# Patient Record
Sex: Male | Born: 1950 | Race: White | Hispanic: No | Marital: Married | Smoking: Never smoker
Health system: Southern US, Community
[De-identification: ages and names within clinical notes are randomized; demographics above are authoritative.]

## PROBLEM LIST (undated history)

## (undated) DIAGNOSIS — H9190 Unspecified hearing loss, unspecified ear: Secondary | ICD-10-CM

## (undated) DIAGNOSIS — H8113 Benign paroxysmal vertigo, bilateral: Secondary | ICD-10-CM

## (undated) DIAGNOSIS — K219 Gastro-esophageal reflux disease without esophagitis: Secondary | ICD-10-CM

## (undated) DIAGNOSIS — R972 Elevated prostate specific antigen [PSA]: Secondary | ICD-10-CM

## (undated) HISTORY — DX: Elevated prostate specific antigen (PSA): R97.20

## (undated) HISTORY — DX: Gastro-esophageal reflux disease without esophagitis: K21.9

## (undated) HISTORY — DX: Unspecified hearing loss, unspecified ear: H91.90

## (undated) HISTORY — PX: TONSILLECTOMY: SUR1361

---

## 2005-11-12 ENCOUNTER — Ambulatory Visit: Payer: Self-pay | Admitting: Internal Medicine

## 2013-10-27 ENCOUNTER — Ambulatory Visit: Payer: Self-pay | Admitting: Unknown Physician Specialty

## 2013-10-27 LAB — HM COLONOSCOPY

## 2013-10-29 LAB — PATHOLOGY REPORT

## 2014-01-04 DIAGNOSIS — H2510 Age-related nuclear cataract, unspecified eye: Secondary | ICD-10-CM | POA: Diagnosis not present

## 2014-04-26 DIAGNOSIS — H2511 Age-related nuclear cataract, right eye: Secondary | ICD-10-CM | POA: Diagnosis not present

## 2014-09-16 DIAGNOSIS — R300 Dysuria: Secondary | ICD-10-CM | POA: Diagnosis not present

## 2014-09-16 DIAGNOSIS — Z125 Encounter for screening for malignant neoplasm of prostate: Secondary | ICD-10-CM | POA: Diagnosis not present

## 2014-09-16 DIAGNOSIS — N401 Enlarged prostate with lower urinary tract symptoms: Secondary | ICD-10-CM | POA: Diagnosis not present

## 2014-09-16 DIAGNOSIS — R972 Elevated prostate specific antigen [PSA]: Secondary | ICD-10-CM | POA: Diagnosis not present

## 2014-10-05 DIAGNOSIS — R102 Pelvic and perineal pain: Secondary | ICD-10-CM | POA: Diagnosis not present

## 2014-10-05 DIAGNOSIS — N401 Enlarged prostate with lower urinary tract symptoms: Secondary | ICD-10-CM | POA: Diagnosis not present

## 2014-10-18 DIAGNOSIS — N401 Enlarged prostate with lower urinary tract symptoms: Secondary | ICD-10-CM | POA: Diagnosis not present

## 2014-10-26 DIAGNOSIS — R972 Elevated prostate specific antigen [PSA]: Secondary | ICD-10-CM | POA: Diagnosis not present

## 2014-10-26 DIAGNOSIS — R351 Nocturia: Secondary | ICD-10-CM | POA: Diagnosis not present

## 2014-10-26 DIAGNOSIS — R350 Frequency of micturition: Secondary | ICD-10-CM | POA: Diagnosis not present

## 2014-10-26 DIAGNOSIS — N401 Enlarged prostate with lower urinary tract symptoms: Secondary | ICD-10-CM | POA: Diagnosis not present

## 2014-11-29 DIAGNOSIS — G43109 Migraine with aura, not intractable, without status migrainosus: Secondary | ICD-10-CM | POA: Diagnosis not present

## 2014-11-29 DIAGNOSIS — Z961 Presence of intraocular lens: Secondary | ICD-10-CM | POA: Diagnosis not present

## 2015-01-24 DIAGNOSIS — R351 Nocturia: Secondary | ICD-10-CM | POA: Diagnosis not present

## 2015-01-24 DIAGNOSIS — R3914 Feeling of incomplete bladder emptying: Secondary | ICD-10-CM | POA: Diagnosis not present

## 2015-01-24 DIAGNOSIS — N401 Enlarged prostate with lower urinary tract symptoms: Secondary | ICD-10-CM | POA: Diagnosis not present

## 2015-01-24 DIAGNOSIS — R350 Frequency of micturition: Secondary | ICD-10-CM | POA: Diagnosis not present

## 2015-01-24 DIAGNOSIS — Z125 Encounter for screening for malignant neoplasm of prostate: Secondary | ICD-10-CM | POA: Diagnosis not present

## 2015-07-24 DIAGNOSIS — R350 Frequency of micturition: Secondary | ICD-10-CM | POA: Diagnosis not present

## 2015-07-24 DIAGNOSIS — N401 Enlarged prostate with lower urinary tract symptoms: Secondary | ICD-10-CM | POA: Diagnosis not present

## 2015-07-24 DIAGNOSIS — Z125 Encounter for screening for malignant neoplasm of prostate: Secondary | ICD-10-CM | POA: Diagnosis not present

## 2015-10-16 ENCOUNTER — Encounter: Payer: Self-pay | Admitting: Family Medicine

## 2015-10-16 DIAGNOSIS — R490 Dysphonia: Secondary | ICD-10-CM | POA: Insufficient documentation

## 2015-10-16 DIAGNOSIS — K219 Gastro-esophageal reflux disease without esophagitis: Secondary | ICD-10-CM | POA: Insufficient documentation

## 2015-10-16 DIAGNOSIS — R972 Elevated prostate specific antigen [PSA]: Secondary | ICD-10-CM

## 2015-10-16 DIAGNOSIS — H9190 Unspecified hearing loss, unspecified ear: Secondary | ICD-10-CM

## 2015-10-17 ENCOUNTER — Encounter: Payer: Self-pay | Admitting: Family Medicine

## 2015-10-17 ENCOUNTER — Ambulatory Visit (INDEPENDENT_AMBULATORY_CARE_PROVIDER_SITE_OTHER): Payer: Medicare Other | Admitting: Family Medicine

## 2015-10-17 VITALS — BP 119/78 | HR 62 | Temp 98.6°F | Resp 16 | Ht 70.0 in | Wt 205.0 lb

## 2015-10-17 DIAGNOSIS — N401 Enlarged prostate with lower urinary tract symptoms: Secondary | ICD-10-CM

## 2015-10-17 DIAGNOSIS — L989 Disorder of the skin and subcutaneous tissue, unspecified: Secondary | ICD-10-CM | POA: Diagnosis not present

## 2015-10-17 DIAGNOSIS — Z23 Encounter for immunization: Secondary | ICD-10-CM

## 2015-10-17 DIAGNOSIS — K219 Gastro-esophageal reflux disease without esophagitis: Secondary | ICD-10-CM | POA: Diagnosis not present

## 2015-10-17 DIAGNOSIS — N138 Other obstructive and reflux uropathy: Secondary | ICD-10-CM | POA: Insufficient documentation

## 2015-10-17 DIAGNOSIS — E785 Hyperlipidemia, unspecified: Secondary | ICD-10-CM | POA: Insufficient documentation

## 2015-10-17 DIAGNOSIS — N400 Enlarged prostate without lower urinary tract symptoms: Secondary | ICD-10-CM | POA: Diagnosis not present

## 2015-10-17 DIAGNOSIS — E7800 Pure hypercholesterolemia, unspecified: Secondary | ICD-10-CM | POA: Diagnosis not present

## 2015-10-17 DIAGNOSIS — Z0000 Encounter for general adult medical examination without abnormal findings: Secondary | ICD-10-CM | POA: Diagnosis not present

## 2015-10-17 DIAGNOSIS — J370 Chronic laryngitis: Secondary | ICD-10-CM | POA: Diagnosis not present

## 2015-10-17 NOTE — Progress Notes (Signed)
Name: Rodney Cummings   MRN: [id number]    DOB: 11/09/1950   Date:10/17/2015       Progress Note  Subjective  Chief Complaint  Chief Complaint  Patient presents with  . Annual Exam    HPI Here for annual complete physical.  Has BPH sx.  Sees Dr. Boneta Lucks for this.  He is complaining with some the side effects.  Has had a skin lesion frozen by Dr. Phillip Heal, but it seems that it is coming back.  C/o fatigue.  Doesn't rest well in AM.  He has family hx. Of dementia.  He states that he gets a little forgetful at times.  He does get some horseness on and off.  He chews tobacco daily.  No problem-specific assessment & plan notes found for this encounter.   Past Medical History  Diagnosis Date  . GERD (gastroesophageal reflux disease)   . Elevated PSA   . Hearing loss     Past Surgical History  Procedure Laterality Date  . Tonsillectomy      age 49    Family History  Problem Relation Age of Onset  . Cancer Paternal Grandmother   . Cancer Paternal Grandfather   . Dementia Mother   . Cancer Father     throat/stomach  . Dementia Maternal Aunt   . Dementia Maternal Uncle     Social History   Social History  . Marital Status: Married    Spouse Name: N/A  . Number of Children: N/A  . Years of Education: N/A   Occupational History  . Not on file.   Social History Main Topics  . Smoking status: Never Smoker   . Smokeless tobacco: Current User    Types: Chew  . Alcohol Use: 0.0 oz/week    0 Standard drinks or equivalent per week     Comment: occasional  . Drug Use: No  . Sexual Activity: Not on file   Other Topics Concern  . Not on file   Social History Narrative     Current outpatient prescriptions:  .  tamsulosin (FLOMAX) 0.4 MG CAPS capsule, Take 0.4 mg by mouth daily., Disp: , Rfl:   Not on File   Review of Systems  Constitutional: Negative for fever, chills, weight loss and malaise/fatigue.  HENT: Negative for hearing loss.        Hoareness occ.  Eyes:  Negative for blurred vision and double vision.  Respiratory: Negative for cough, shortness of breath and wheezing.   Cardiovascular: Negative for chest pain, palpitations and leg swelling.  Gastrointestinal: Positive for heartburn (rare). Negative for abdominal pain and blood in stool.  Genitourinary: Positive for urgency and frequency. Negative for dysuria.  Musculoskeletal: Negative for myalgias, back pain and joint pain.  Skin: Negative for rash.  Neurological: Negative for tremors, weakness and headaches.      Objective  Filed Vitals:   10/17/15 1002  BP: 119/78  Pulse: 62  Temp: 98.6 F (37 C)  TempSrc: Oral  Resp: 16  Height: 5\' 10"  (1.778 m)  Weight: 205 lb (92.987 kg)    Physical Exam  Constitutional: He is oriented to person, place, and time and well-developed, well-nourished, and in no distress. No distress.  HENT:  Head: Normocephalic and atraumatic.  Right Ear: External ear normal.  Left Ear: External ear normal.  Nose: Nose normal.  Mouth/Throat: Oropharynx is clear and moist.  Eyes: Conjunctivae and EOM are normal. Pupils are equal, round, and reactive to light. No scleral icterus.  Fundoscopic exam:      The right eye shows no arteriolar narrowing, no AV nicking, no exudate, no hemorrhage and no papilledema.       The left eye shows no arteriolar narrowing, no AV nicking, no exudate, no hemorrhage and no papilledema.  Neck: Normal range of motion. Neck supple. No thyromegaly present.  Cardiovascular: Normal rate, regular rhythm and normal heart sounds.  Exam reveals no gallop and no friction rub.   No murmur heard. Pulmonary/Chest: Effort normal and breath sounds normal. No respiratory distress. He has no wheezes. He has no rales.  Abdominal: Soft. Bowel sounds are normal. He exhibits no distension and no mass. There is no tenderness.  Genitourinary: Penis normal.  Musculoskeletal: Normal range of motion. He exhibits no edema.  Lymphadenopathy:    He has  no cervical adenopathy.  Neurological: He is alert and oriented to person, place, and time. Gait normal.  Skin:  Recurrent dark lesion on penis and and enlarging irregularly shaped lesion on R torso.  Psychiatric: Mood, memory, affect and judgment normal.  Vitals reviewed.      No results found for this or any previous visit (from the past 2160 hour(s)).   Assessment & Plan  Problem List Items Addressed This Visit      Respiratory   Laryngitis, chronic   Relevant Orders   Ambulatory referral to ENT     Digestive   GERD (gastroesophageal reflux disease)   Relevant Orders   CBC with Differential     Musculoskeletal and Integument   Skin lesions   Relevant Orders   Ambulatory referral to Dermatology     Other   Enlarged prostate - Primary   Elevated cholesterol   Relevant Orders   Lipid Profile    Other Visit Diagnoses    Health maintenance examination        Relevant Orders    Comprehensive Metabolic Panel (CMET)    Immunization due        Relevant Orders    Pneumococcal conjugate vaccine 13-valent    Tdap vaccine greater than or equal to 7yo IM       Meds ordered this encounter  Medications  . tamsulosin (FLOMAX) 0.4 MG CAPS capsule    Sig: Take 0.4 mg by mouth daily.  Marland Kitchen DISCONTD: HYDROcodone-acetaminophen (NORCO/VICODIN) 5-325 MG tablet    Sig: Take 1 tablet by mouth as needed.   1. Health maintenance examination  - Comprehensive Metabolic Panel (CMET)  2. Enlarged prostate Cont to see Dr. Boneta Lucks  3. Laryngitis, chronic  - Ambulatory referral to ENT  4. Skin lesions  - Ambulatory referral to Dermatology  5. Immunization due  - Pneumococcal conjugate vaccine 13-valent - Tdap vaccine greater than or equal to 7yo IM  6. Gastroesophageal reflux disease without esophagitis  - CBC with Differential  7. Elevated cholesterol  - Lipid Profile

## 2015-10-18 DIAGNOSIS — E7800 Pure hypercholesterolemia, unspecified: Secondary | ICD-10-CM | POA: Diagnosis not present

## 2015-10-18 DIAGNOSIS — K219 Gastro-esophageal reflux disease without esophagitis: Secondary | ICD-10-CM | POA: Diagnosis not present

## 2015-10-18 DIAGNOSIS — Z0000 Encounter for general adult medical examination without abnormal findings: Secondary | ICD-10-CM | POA: Diagnosis not present

## 2015-10-19 LAB — CBC WITH DIFFERENTIAL/PLATELET
BASOS ABS: 0 10*3/uL (ref 0.0–0.2)
Basos: 1 %
EOS (ABSOLUTE): 0.6 10*3/uL — AB (ref 0.0–0.4)
Eos: 10 %
Hematocrit: 43.7 % (ref 37.5–51.0)
Hemoglobin: 14.6 g/dL (ref 12.6–17.7)
Immature Grans (Abs): 0 10*3/uL (ref 0.0–0.1)
Immature Granulocytes: 0 %
LYMPHS ABS: 1.7 10*3/uL (ref 0.7–3.1)
Lymphs: 30 %
MCH: 30.7 pg (ref 26.6–33.0)
MCHC: 33.4 g/dL (ref 31.5–35.7)
MCV: 92 fL (ref 79–97)
MONOS ABS: 0.6 10*3/uL (ref 0.1–0.9)
Monocytes: 11 %
NEUTROS PCT: 48 %
Neutrophils Absolute: 2.7 10*3/uL (ref 1.4–7.0)
PLATELETS: 267 10*3/uL (ref 150–379)
RBC: 4.75 x10E6/uL (ref 4.14–5.80)
RDW: 13.2 % (ref 12.3–15.4)
WBC: 5.7 10*3/uL (ref 3.4–10.8)

## 2015-10-19 LAB — COMPREHENSIVE METABOLIC PANEL
ALT: 23 IU/L (ref 0–44)
AST: 18 IU/L (ref 0–40)
Albumin/Globulin Ratio: 2.1 (ref 1.2–2.2)
Albumin: 4.2 g/dL (ref 3.6–4.8)
Alkaline Phosphatase: 58 IU/L (ref 39–117)
BUN/Creatinine Ratio: 12 (ref 10–24)
BUN: 11 mg/dL (ref 8–27)
Bilirubin Total: 0.6 mg/dL (ref 0.0–1.2)
CALCIUM: 9 mg/dL (ref 8.6–10.2)
CO2: 25 mmol/L (ref 18–29)
Chloride: 103 mmol/L (ref 96–106)
Creatinine, Ser: 0.94 mg/dL (ref 0.76–1.27)
GFR calc Af Amer: 97 mL/min/{1.73_m2} (ref >59)
GFR, EST NON AFRICAN AMERICAN: 84 mL/min/{1.73_m2} (ref >59)
Globulin, Total: 2 g/dL (ref 1.5–4.5)
Glucose: 87 mg/dL (ref 65–99)
Potassium: 4.8 mmol/L (ref 3.5–5.2)
SODIUM: 140 mmol/L (ref 134–144)
Total Protein: 6.2 g/dL (ref 6.0–8.5)

## 2015-10-19 LAB — LIPID PANEL
Chol/HDL Ratio: 2.6 ratio units (ref 0.0–5.0)
Cholesterol, Total: 205 mg/dL — ABNORMAL HIGH (ref 100–199)
HDL: 80 mg/dL (ref >39)
LDL Calculated: 114 mg/dL — ABNORMAL HIGH (ref 0–99)
Triglycerides: 55 mg/dL (ref 0–149)
VLDL Cholesterol Cal: 11 mg/dL (ref 5–40)

## 2015-10-20 ENCOUNTER — Encounter: Payer: Self-pay | Admitting: *Deleted

## 2015-10-25 ENCOUNTER — Ambulatory Visit: Payer: Medicare Other

## 2015-10-25 ENCOUNTER — Encounter: Payer: Self-pay | Admitting: *Deleted

## 2015-10-25 ENCOUNTER — Ambulatory Visit
Admission: EM | Admit: 2015-10-25 | Discharge: 2015-10-25 | Disposition: A | Discharge status: Home / Self Care | Payer: Medicare Other | Attending: Family Medicine | Admitting: Family Medicine

## 2015-10-25 DIAGNOSIS — F17200 Nicotine dependence, unspecified, uncomplicated: Secondary | ICD-10-CM | POA: Diagnosis not present

## 2015-10-25 DIAGNOSIS — X58XXXA Exposure to other specified factors, initial encounter: Secondary | ICD-10-CM | POA: Diagnosis not present

## 2015-10-25 DIAGNOSIS — K219 Gastro-esophageal reflux disease without esophagitis: Secondary | ICD-10-CM | POA: Diagnosis not present

## 2015-10-25 DIAGNOSIS — S60222A Contusion of left hand, initial encounter: Secondary | ICD-10-CM | POA: Diagnosis not present

## 2015-10-25 DIAGNOSIS — M7989 Other specified soft tissue disorders: Secondary | ICD-10-CM | POA: Diagnosis not present

## 2015-10-25 DIAGNOSIS — M79642 Pain in left hand: Secondary | ICD-10-CM | POA: Diagnosis not present

## 2015-10-25 MED ORDER — NAPROXEN 500 MG PO TABS: 500.0000 mg | ORAL_TABLET | Freq: Two times a day (BID) | ORAL | Status: DC

## 2015-10-25 NOTE — ED Provider Notes (Addendum)
CSN: [id number]     Arrival date & time 10/25/15  [id number] History   First MD Initiated Contact with Patient 10/25/15 [phone]     Chief Complaint  Patient presents with  . Hand Injury   (Consider location/radiation/quality/duration/timing/severity/associated sxs/prior Treatment) HPI: Patient presents today with left hand swelling and pain. Patient states yesterday a metal bar flipped back and hit his left hand. He states he was wearing gloves at the time. He has not put ice on the area yet. He does admit to having a [place] finger repaired on the left fourth digit before. He admits to having full function of all digits of his left hand prior to the injury. He denies any known history of a metacarpal fracture in the past. Denies any other injury anywhere else. He is up-to-date with his tetanus immunization.  Past Medical History  Diagnosis Date  . GERD (gastroesophageal reflux disease)   . Elevated PSA   . Hearing loss    Past Surgical History  Procedure Laterality Date  . Tonsillectomy      age 65   Family History  Problem Relation Age of Onset  . Cancer Paternal Grandmother   . Cancer Paternal Grandfather   . Dementia Mother   . Cancer Father     throat/stomach  . Dementia Maternal Aunt   . Dementia Maternal Uncle    Social History  Substance Use Topics  . Smoking status: Never Smoker   . Smokeless tobacco: Current User    Types: Chew  . Alcohol Use: 0.0 oz/week    0 Standard drinks or equivalent per week     Comment: occasional    Review of Systems: Negative except mentioned above.   Allergies  Review of patient's allergies indicates not on file.  Home Medications   Prior to Admission medications   Medication Sig Start Date End Date Taking? Authorizing Provider  tamsulosin (FLOMAX) 0.4 MG CAPS capsule Take 0.4 mg by mouth daily. 10/06/15  Yes Historical Provider, MD   Meds Ordered and Administered this Visit  Medications - No data to display  BP 115/66 mmHg  Pulse 66   Temp(Src) 97.7 F (36.5 C) (Oral)  Resp 16  Ht 5\' 11"  (1.803 m)  Wt 195 lb (88.451 kg)  BMI 27.21 kg/m2  SpO2 100% No data found.   Physical Exam .  GENERAL: NAD RESP: CTA B CARD: RRR MSK: Left Hand- Mild to moderate erythema and swelling of the distal fourth and fifth metacarpal area, he does have tenderness in this area as well, has slightly decreased range of motion of fourth and fifth digits due to the swelling in the metacarpal area, neurovascularly intact NEURO: CN II-XII grossly intact   ED Course  Procedures (including critical care time)  Labs Review Labs Reviewed - No data to display  Imaging Review No results found.      MDM  A/P: Left hand contusion- Series read as negative for acute fracture, RICE, ace wrap, Naprosyn when necessary, if symptoms persist or worsen I do recommend that he have repeat x-rays and follow-up here or with orthopedics. Discussed signs of infection however unlikely due to the fact that the patient was wearing gloves and does not have any abrasions. Patient appreciative of care.    Paulina Fusi, MD 10/25/15 1031  Paulina Fusi, MD 10/25/15 [phone]

## 2015-10-25 NOTE — ED Notes (Addendum)
Patient injured the back of his left hand with a piece of bar steel while installing fence yesterday.Swelling is present on at the knuckle of his left hand. Patient does have a history of tendon surgery in same location. Patient has broken multiple fingers when younger playing tag football.

## 2015-11-15 ENCOUNTER — Encounter: Payer: Self-pay | Admitting: Emergency Medicine

## 2015-11-15 ENCOUNTER — Ambulatory Visit: Payer: Medicare Other

## 2015-11-15 ENCOUNTER — Ambulatory Visit
Admission: EM | Admit: 2015-11-15 | Discharge: 2015-11-15 | Disposition: A | Discharge status: Home / Self Care | Payer: Medicare Other | Attending: Family Medicine | Admitting: Family Medicine

## 2015-11-15 DIAGNOSIS — H9190 Unspecified hearing loss, unspecified ear: Secondary | ICD-10-CM | POA: Insufficient documentation

## 2015-11-15 DIAGNOSIS — K219 Gastro-esophageal reflux disease without esophagitis: Secondary | ICD-10-CM | POA: Diagnosis not present

## 2015-11-15 DIAGNOSIS — R972 Elevated prostate specific antigen [PSA]: Secondary | ICD-10-CM | POA: Insufficient documentation

## 2015-11-15 DIAGNOSIS — M19042 Primary osteoarthritis, left hand: Secondary | ICD-10-CM | POA: Diagnosis not present

## 2015-11-15 DIAGNOSIS — M79642 Pain in left hand: Secondary | ICD-10-CM | POA: Diagnosis not present

## 2015-11-15 NOTE — ED Notes (Signed)
Pt reports continued left hand pain since seen here 5/17 for same. Pt reports anytime uses left hand pain is sharp and has had continued swelling

## 2015-11-15 NOTE — ED Provider Notes (Signed)
CSN: [id number]     Arrival date & time 11/15/15  1257 History   First MD Initiated Contact with Patient 11/15/15 1416     Chief Complaint  Patient presents with  . Hand Pain   (Consider location/radiation/quality/duration/timing/severity/associated sxs/prior Treatment) HPI: Patient returns to the office today with continued left hand pain. Patient states that he had an injury a few weeks ago and has continued to have pain and some swelling of the area. He however does admit that he has not immobilized the hand but for a few days. He has been doing activity as normal using the hand. His pain is localized mostly to the left fourth metacarpal area. X-rays were negative for fracture initially when he was seen here on 10/25/15.  Past Medical History  Diagnosis Date  . GERD (gastroesophageal reflux disease)   . Elevated PSA   . Hearing loss    Past Surgical History  Procedure Laterality Date  . Tonsillectomy      age 65   Family History  Problem Relation Age of Onset  . Cancer Paternal Grandmother   . Cancer Paternal Grandfather   . Dementia Mother   . Cancer Father     throat/stomach  . Dementia Maternal Aunt   . Dementia Maternal Uncle    Social History  Substance Use Topics  . Smoking status: Never Smoker   . Smokeless tobacco: Current User    Types: Chew  . Alcohol Use: 0.0 oz/week    0 Standard drinks or equivalent per week     Comment: occasional    Review of Systems: Negative except mentioned above.   Allergies  Review of patient's allergies indicates no known allergies.  Home Medications   Prior to Admission medications   Medication Sig Start Date End Date Taking? Authorizing Provider  naproxen (NAPROSYN) 500 MG tablet Take 1 tablet (500 mg total) by mouth 2 (two) times daily. Take with food. 10/25/15   Paulina Fusi, MD  tamsulosin (FLOMAX) 0.4 MG CAPS capsule Take 0.4 mg by mouth daily. 10/06/15   Historical Provider, MD   Meds Ordered and Administered this Visit   Medications - No data to display  BP 106/68 mmHg  Pulse 58  Temp(Src) 97.9 F (36.6 C) (Tympanic)  Resp 16  Ht 6' (1.829 m)  Wt 198 lb (89.812 kg)  BMI 26.85 kg/m2  SpO2 100% No data found.   Physical Exam   GENERAL: NAD MSK: L Hand- no significant swelling appreciated, tenderness localized to 4th metacarpal area, can make fist but discomfort in area, nv intact  NEURO: CN II-XII groslly intact   ED Course  Procedures (including critical care time)  Labs Review Labs Reviewed - No data to display  Imaging Review No results found.    MDM  A/P: Left hand pain/injury- x-rays were repeated and there does not appear to be a fracture. I discussed with the patient that there could be a subtle fracture that is not being picked up on x-ray and that a CT would be needed to look at this further. There is also possibility that there is bone bruising of the area that is still not healed yet. I have asked the patient to follow-up with hand orthopedics for further evaluation and treatment. Patient has been given x-rays on a disc. Advised over-the-counter pain medication or Naprosyn when necessary. Patient appreciative.    Paulina Fusi, MD 11/15/15 [phone]

## 2015-11-20 DIAGNOSIS — R490 Dysphonia: Secondary | ICD-10-CM | POA: Diagnosis not present

## 2015-11-20 DIAGNOSIS — K219 Gastro-esophageal reflux disease without esophagitis: Secondary | ICD-10-CM | POA: Diagnosis not present

## 2015-12-21 DIAGNOSIS — D074 Carcinoma in situ of penis: Secondary | ICD-10-CM | POA: Diagnosis not present

## 2015-12-21 DIAGNOSIS — L821 Other seborrheic keratosis: Secondary | ICD-10-CM | POA: Diagnosis not present

## 2016-01-01 DIAGNOSIS — R490 Dysphonia: Secondary | ICD-10-CM | POA: Diagnosis not present

## 2016-01-01 DIAGNOSIS — K219 Gastro-esophageal reflux disease without esophagitis: Secondary | ICD-10-CM | POA: Diagnosis not present

## 2016-01-24 DIAGNOSIS — R350 Frequency of micturition: Secondary | ICD-10-CM | POA: Diagnosis not present

## 2016-01-24 DIAGNOSIS — R3915 Urgency of urination: Secondary | ICD-10-CM | POA: Diagnosis not present

## 2016-01-24 DIAGNOSIS — R972 Elevated prostate specific antigen [PSA]: Secondary | ICD-10-CM | POA: Diagnosis not present

## 2016-01-24 DIAGNOSIS — R3914 Feeling of incomplete bladder emptying: Secondary | ICD-10-CM | POA: Diagnosis not present

## 2016-01-24 DIAGNOSIS — N401 Enlarged prostate with lower urinary tract symptoms: Secondary | ICD-10-CM | POA: Diagnosis not present

## 2016-04-23 ENCOUNTER — Ambulatory Visit (INDEPENDENT_AMBULATORY_CARE_PROVIDER_SITE_OTHER): Payer: Medicare Other | Admitting: Family Medicine

## 2016-04-23 ENCOUNTER — Encounter: Payer: Self-pay | Admitting: Family Medicine

## 2016-04-23 VITALS — BP 113/72 | HR 51 | Temp 97.9°F | Resp 16 | Ht 70.0 in | Wt 207.0 lb

## 2016-04-23 DIAGNOSIS — K2100 Gastro-esophageal reflux disease with esophagitis, without bleeding: Secondary | ICD-10-CM

## 2016-04-23 DIAGNOSIS — N400 Enlarged prostate without lower urinary tract symptoms: Secondary | ICD-10-CM | POA: Diagnosis not present

## 2016-04-23 DIAGNOSIS — Z23 Encounter for immunization: Secondary | ICD-10-CM

## 2016-04-23 NOTE — Progress Notes (Signed)
Name: Rodney Cummings   MRN: [id number]    DOB: 06-Apr-1951   Date:04/23/2016       Progress Note  Subjective  Chief Complaint  Chief Complaint  Patient presents with  . Gastroesophageal Reflux    HPI Here for f/u of GERD.  Doing well overall on Prilosec.  His prostate sx have improved.  He is concerned about being tired and getting Alzheimer's disease.  No problem-specific Assessment & Plan notes found for this encounter.   Past Medical History:  Diagnosis Date  . Elevated PSA   . GERD (gastroesophageal reflux disease)   . Hearing loss     Past Surgical History:  Procedure Laterality Date  . TONSILLECTOMY     age 73    Family History  Problem Relation Age of Onset  . Cancer Paternal Grandmother   . Cancer Paternal Grandfather   . Dementia Mother   . Cancer Father     throat/stomach  . Dementia Maternal Aunt   . Dementia Maternal Uncle     Social History   Social History  . Marital status: Married    Spouse name: N/A  . Number of children: N/A  . Years of education: N/A   Occupational History  . Not on file.   Social History Main Topics  . Smoking status: Never Smoker  . Smokeless tobacco: Current User    Types: Chew  . Alcohol use 0.0 oz/week     Comment: occasional  . Drug use: No  . Sexual activity: Not on file   Other Topics Concern  . Not on file   Social History Narrative  . No narrative on file     Current Outpatient Prescriptions:  .  omeprazole (PRILOSEC) 40 MG capsule, Take 40 mg by mouth daily., Disp: , Rfl:  .  tamsulosin (FLOMAX) 0.4 MG CAPS capsule, Take 0.4 mg by mouth daily., Disp: , Rfl:   Not on File   Review of Systems  Constitutional: Negative for chills, fever, malaise/fatigue and weight loss.  HENT: Negative for hearing loss and tinnitus.   Eyes: Negative for blurred vision and double vision.  Respiratory: Negative for cough, shortness of breath and wheezing.   Cardiovascular: Negative for chest pain, palpitations and  leg swelling.  Gastrointestinal: Negative for abdominal pain, blood in stool and heartburn.  Genitourinary: Negative for dysuria, frequency and urgency.  Musculoskeletal: Negative for joint pain and myalgias.  Skin: Negative for rash.  Neurological: Negative for dizziness, tingling, tremors, weakness and headaches.      Objective  Vitals:   04/23/16 1056  BP: 113/72  Pulse: (!) 51  Resp: 16  Temp: 97.9 F (36.6 C)  TempSrc: Oral  Weight: 207 lb (93.9 kg)  Height: 5\' 10"  (1.778 m)    Physical Exam  Constitutional: He is oriented to person, place, and time and well-developed, well-nourished, and in no distress. No distress.  HENT:  Head: Normocephalic and atraumatic.  Eyes: Conjunctivae and EOM are normal. Pupils are equal, round, and reactive to light.  Neck: Normal range of motion. Neck supple. No thyromegaly present.  Cardiovascular: Normal rate, regular rhythm and normal heart sounds.  Exam reveals no gallop and no friction rub.   No murmur heard. Pulmonary/Chest: Effort normal and breath sounds normal. No respiratory distress. He has no wheezes. He has no rales.  Abdominal: Soft. Bowel sounds are normal. He exhibits no distension and no mass. There is no tenderness.  Musculoskeletal: He exhibits no edema.  Lymphadenopathy:  He has no cervical adenopathy.  Neurological: He is alert and oriented to person, place, and time.  Vitals reviewed.      No results found for this or any previous visit (from the past 2160 hour(s)).   Assessment & Plan  Problem List Items Addressed This Visit      Digestive   GERD (gastroesophageal reflux disease)   Relevant Medications   omeprazole (PRILOSEC) 40 MG capsule     Other   Enlarged prostate    Other Visit Diagnoses    Need for vaccination    -  Primary   Relevant Orders   Flu vaccine HIGH DOSE PF (Fluzone High dose) (Completed)      Meds ordered this encounter  Medications  . omeprazole (PRILOSEC) 40 MG capsule     Sig: Take 40 mg by mouth daily.   1. Need for vaccination  - Flu vaccine HIGH DOSE PF (Fluzone High dose)  2. Gastroesophageal reflux disease with esophagitis Cont Omeprazole  3. Enlarged prostate

## 2016-08-22 DIAGNOSIS — H04123 Dry eye syndrome of bilateral lacrimal glands: Secondary | ICD-10-CM | POA: Diagnosis not present

## 2016-08-22 DIAGNOSIS — Z961 Presence of intraocular lens: Secondary | ICD-10-CM | POA: Diagnosis not present

## 2016-08-27 DIAGNOSIS — N401 Enlarged prostate with lower urinary tract symptoms: Secondary | ICD-10-CM | POA: Diagnosis not present

## 2016-08-27 DIAGNOSIS — R3916 Straining to void: Secondary | ICD-10-CM | POA: Diagnosis not present

## 2016-08-27 DIAGNOSIS — R350 Frequency of micturition: Secondary | ICD-10-CM | POA: Diagnosis not present

## 2016-08-27 DIAGNOSIS — R3915 Urgency of urination: Secondary | ICD-10-CM | POA: Diagnosis not present

## 2016-10-21 ENCOUNTER — Ambulatory Visit: Payer: Medicare Other | Admitting: Family Medicine

## 2016-11-18 DIAGNOSIS — H26493 Other secondary cataract, bilateral: Secondary | ICD-10-CM | POA: Diagnosis not present

## 2016-11-18 DIAGNOSIS — H532 Diplopia: Secondary | ICD-10-CM | POA: Diagnosis not present

## 2016-11-18 DIAGNOSIS — H43393 Other vitreous opacities, bilateral: Secondary | ICD-10-CM | POA: Diagnosis not present

## 2016-12-24 DIAGNOSIS — I708 Atherosclerosis of other arteries: Secondary | ICD-10-CM | POA: Diagnosis not present

## 2016-12-24 DIAGNOSIS — H26492 Other secondary cataract, left eye: Secondary | ICD-10-CM | POA: Diagnosis not present

## 2016-12-24 DIAGNOSIS — H26493 Other secondary cataract, bilateral: Secondary | ICD-10-CM | POA: Diagnosis not present

## 2016-12-24 DIAGNOSIS — Z961 Presence of intraocular lens: Secondary | ICD-10-CM | POA: Diagnosis not present

## 2016-12-24 DIAGNOSIS — H01003 Unspecified blepharitis right eye, unspecified eyelid: Secondary | ICD-10-CM | POA: Diagnosis not present

## 2017-01-06 DIAGNOSIS — H26491 Other secondary cataract, right eye: Secondary | ICD-10-CM | POA: Diagnosis not present

## 2017-01-14 ENCOUNTER — Ambulatory Visit (INDEPENDENT_AMBULATORY_CARE_PROVIDER_SITE_OTHER): Payer: Medicare Other | Admitting: Family Medicine

## 2017-01-14 ENCOUNTER — Other Ambulatory Visit: Payer: Self-pay | Admitting: Family Medicine

## 2017-01-14 ENCOUNTER — Encounter: Payer: Self-pay | Admitting: Family Medicine

## 2017-01-14 VITALS — BP 101/66 | HR 67 | Temp 98.6°F | Resp 16 | Ht 70.0 in | Wt 197.6 lb

## 2017-01-14 DIAGNOSIS — R5383 Other fatigue: Secondary | ICD-10-CM

## 2017-01-14 DIAGNOSIS — Z125 Encounter for screening for malignant neoplasm of prostate: Secondary | ICD-10-CM

## 2017-01-14 DIAGNOSIS — N401 Enlarged prostate with lower urinary tract symptoms: Secondary | ICD-10-CM

## 2017-01-14 DIAGNOSIS — R799 Abnormal finding of blood chemistry, unspecified: Secondary | ICD-10-CM

## 2017-01-14 DIAGNOSIS — E7800 Pure hypercholesterolemia, unspecified: Secondary | ICD-10-CM

## 2017-01-14 DIAGNOSIS — H9113 Presbycusis, bilateral: Secondary | ICD-10-CM | POA: Diagnosis not present

## 2017-01-14 DIAGNOSIS — E559 Vitamin D deficiency, unspecified: Secondary | ICD-10-CM

## 2017-01-14 DIAGNOSIS — R351 Nocturia: Secondary | ICD-10-CM

## 2017-01-14 DIAGNOSIS — R972 Elevated prostate specific antigen [PSA]: Secondary | ICD-10-CM

## 2017-01-14 DIAGNOSIS — Z1159 Encounter for screening for other viral diseases: Secondary | ICD-10-CM

## 2017-01-14 NOTE — Addendum Note (Signed)
Addended by: Olin Hauser on: 01/14/2017 10:09 PM   Modules accepted: Orders

## 2017-01-14 NOTE — Assessment & Plan Note (Signed)
Stable, chronic high frequency hearing loss, with hearing aids bilateral, previously followed by [facility] for Audiology

## 2017-01-14 NOTE — Assessment & Plan Note (Signed)
Stable chronic BPH with nocturia as LUTS otherwise no significant obstruction Followed by Dr Yves Dill [facility]) - do not have record available - On OTC saw palmetto, failed Flomax (postural symptoms) - Also prior rx Finasteride but he never started - Last PSA unavailable (reported to be approx 4) - No known personal/family history of prostate CA  Plan: 1. Check PSA by request since doing blood work 2. Continue current OTC supplement with improved nocturia 3. Improve hydration 4. Follow-up with existing Urology Dr Yves Dill - will forward PSA If need, and resume routine care, may consider future finasteride but discussed that this lowers PSA needs to be appropriately monitored

## 2017-01-14 NOTE — Patient Instructions (Addendum)
Thank you for coming to the clinic today.  1. We will order blood tests, and contact you by phone with results within few days.  May consider daily Boost or Ensure or daily MVI to get extra.  Sleep Hygiene Recommendations to promote healthy sleep in all patients, especially if symptoms of insomnia are worsening. Due to the nature of sleep rhythms, if your body gets "out of rhythm", it may take some time before your sleep cycle can be "reset".  Please try to follow as many of the following tips as you can, usually there are only a few of these are the primary cause of the problem.  ?To reset your sleep rhythm, go to bed and get up at the same time every day ?Sleep only long enough to feel rested and then get out of bed ?Do not try to force yourself to sleep. If you can't sleep, get out of bed and try again later. ?Avoid naps during the day, unless excessively tired. The more sleeping during the day, then the less sleep your body needs at night.  ?Have coffee, tea, and other foods that have caffeine only in the morning ?Exercise several days a week, but not right before bed ?If you drink alcohol, prefer to have appropriate drink with one meal, but prefer to avoid alcohol in the evening, and bedtime ?If you smoke, avoid smoking, especially in the evening  ?Avoid watching TV or looking at phones, computers, or reading devices ("e-books") that give off light at least 30 minutes before bed. This artificial light sends "awake signals" to your brain and can make it harder to fall asleep. ?Make your bedroom a comfortable place where it is easy to fall asleep: ? Put up shades or special blackout curtains to block light from outside. ? Use a white noise machine to block noise. ? Keep the temperature cool. ?Try your best to solve or at least address your problems before you go to bed ?Use relaxation techniques to manage stress. Ask your health care provider to suggest some techniques that may work  well for you. These may include: ? Breathing exercises. ? Routines to release muscle tension. ? Visualizing peaceful scenes.   DUE for FASTING BLOOD WORK (no food or drink after midnight before the lab appointment, only water or coffee without cream/sugar on the morning of)  SCHEDULE "Lab Only" visit in the morning at the clinic for lab draw in   - Make sure Lab Only appointment is at about 1 week before your next appointment, so that results will be available  Please schedule a Follow-up Appointment to: Return in about 3 months (around 04/16/2017) for Fatigue/Tiredness, lab review.  If you have any other questions or concerns, please feel free to call the clinic or send a message through [facility]. You may also schedule an earlier appointment if necessary.  Additionally, you may be receiving a survey about your experience at our clinic within a few days to 1 week by e-mail or mail. We value your feedback.  Nobie Putnam, DO [place]

## 2017-01-14 NOTE — Assessment & Plan Note (Signed)
Controlled cholesterol on lifestyle Last lipid panel 10/2015  Plan: 1. Briefly discussed ASCVD risk strategies 2. Check fasting lipids tomorrow with other labs 3. Will calculate ASCVD risk and determine if candidate for ASA 81 vs Statin 4. Encourage improved lifestyle - low carb/cholesterol, reduce portion size, continue improving regular exercise 5. Follow-up q 6-12 mo lipids

## 2017-01-14 NOTE — Progress Notes (Signed)
Subjective:    Patient ID: Rodney Cummings, male    DOB: [birth year], 66 y.o.   MRN: [id number]  Rodney Cummings is a 66 y.o. male presenting on 01/14/2017 for Annual Exam  Patient is accompanied by wife, Letta Median. He is here to meet me for first time and establish care with new provider, as previous PCP here has retired. He did not obtain blood work before today's apt.  HPI  CDL physical - Dr Sabra Heck - [facility] ([facility]) 09/18/16  General Fatigue / Tiredness / Low Energy: - New complaint today that he would like to discuss in addition to general physical - Reports that he has been feeling gradually more tired over past 1 year. Difficult to describe exactly, but he and his wife say he sleeps more often and easily takes naps during day, often will get more "tired" quicker during activities. No significant lifestyle changes, he remains retired, active at home working in shop and other household chores - Usual routine goes to bed at 11pm, and wake up 8am, often he will take a nap around 9am and sleeping more during day, often he has days with more hypersomnia and less active and other days very active, and she thinks he does "not know how to pace himself" or he "over-does it" - Describes occasional episodes of more sudden exhaustion than usual with walking, maybe feel dizzy at times, recently if out in heat and not drinking enough water will get dehydrated and can get dizzy if skip meal. But does not endorse any cardiac symptoms (CP, dyspnea) - Also asking about sleep apnea, he had questions of this in past, but he lost weight and improved symptoms significantly. Now snoring is less often and no further witnessed apnea events. See ESS and STOP Bang below  Epworth Sleepiness Scale Total Score: 3-4 (unlikely) Sitting and reading - 0 Watching TV - 0 Sitting inactive in a public place - 0 As a passenger in a car for an hour without a break - 0 Lying down to rest in the afternoon when circumstances  permit - 3 Sitting and talking to someone - 0 Sitting quietly after a lunch without alcohol - 1 In a car, while stopped for a few minutes in traffic - 0  STOP-Bang OSA scoring Snoring no   Tiredness yes   Observed apneas no   Pressure HTN no   BMI > 35 kg/m2 no   Age > 50  yes   Neck (male >17 in; Male >16 in)  No (wear 16.5")   Gender male no   OSA risk low (0-2)  OSA risk intermediate (3-4)  OSA risk high (5+)  Total: 2   -----------------------  History of abnormal cholesterol - Reports history of very good HDL but has had occasional elevated LDL results in past. Never on Statin medication. Also states he took ASA 81 in past but not regularly - Family history of DM but never had abnormal glucose, no prior A1c screening - No history of HTN  History vitamin D mildly low - Currently taking Vitamin D 1,000 iu daily  BPH / History of Elevated PSA - Known history of BPH and mild elevated PSA (approx 4s), followed by Dr Maryan Puls [facility]), he is unsure last visit and when his next visit is, but requests that we check PSA here with labs. - He currently takes OTC prostate health supplement that has saw palmetto among other ingredients - In past failed Flomax 0.4mg  due to  postural dizziness and low BP - He was also rx Finasteride at recent visit but he never started taking this, never filled rx  Health Maintenance: - Colon CA Screening: Last colonoscopy 11/2013 [facility]) has report at home, was told no problem, return 10 year - Routine hepatitis C screening, will get lab - UTD on vaccines, due for flu shot in Fall 2018   Past Medical History:  Diagnosis Date  . Elevated PSA   . GERD (gastroesophageal reflux disease)   . Hearing loss    Past Surgical History:  Procedure Laterality Date  . TONSILLECTOMY     age 44   Social History   Social History  . Marital status: Married    Spouse name: N/A  . Number of children: N/A  . Years of education: N/A    Occupational History  . Not on file.   Social History Main Topics  . Smoking status: Never Smoker  . Smokeless tobacco: Current User    Types: Chew  . Alcohol use 0.0 oz/week     Comment: occasional  . Drug use: No  . Sexual activity: Not on file   Other Topics Concern  . Not on file   Social History Narrative  . No narrative on file   Family History  Problem Relation Age of Onset  . Cancer Paternal Grandmother   . Cancer Paternal Grandfather   . Dementia Mother   . Cancer Father        throat/stomach  . Dementia Maternal Aunt   . Dementia Maternal Uncle    Current Outpatient Prescriptions on File Prior to Visit  Medication Sig  . omeprazole (PRILOSEC) 40 MG capsule Take 40 mg by mouth daily.  . tamsulosin (FLOMAX) 0.4 MG CAPS capsule Take 0.4 mg by mouth daily.   No current facility-administered medications on file prior to visit.     Review of Systems  Constitutional: Positive for fatigue. Negative for activity change, appetite change, chills, diaphoresis and fever.  HENT: Negative for congestion, hearing loss and sinus pressure.   Eyes: Negative for visual disturbance.  Respiratory: Negative for apnea, cough, chest tightness, shortness of breath and wheezing.   Cardiovascular: Negative for chest pain, palpitations and leg swelling.  Gastrointestinal: Negative for abdominal pain, anal bleeding, blood in stool, constipation, diarrhea, nausea and vomiting.  Endocrine: Negative for cold intolerance.  Genitourinary: Negative for decreased urine volume, difficulty urinating, dysuria, frequency, hematuria and testicular pain.  Musculoskeletal: Negative for arthralgias and neck pain.  Skin: Negative for rash.  Allergic/Immunologic: Negative for environmental allergies.  Neurological: Negative for dizziness, weakness, light-headedness, numbness and headaches.  Hematological: Negative for adenopathy.  Psychiatric/Behavioral: Negative for behavioral problems, decreased  concentration, dysphoric mood and sleep disturbance. The patient is not nervous/anxious.    Per HPI unless specifically indicated above     Objective:    BP 101/66   Pulse 67   Temp 98.6 F (37 C) (Oral)   Resp 16   Ht 5\' 10"  (1.778 m)   Wt 197 lb 9.6 oz (89.6 kg)   BMI 28.35 kg/m   Wt Readings from Last 3 Encounters:  01/14/17 197 lb 9.6 oz (89.6 kg)  04/23/16 207 lb (93.9 kg)  11/15/15 198 lb (89.8 kg)    Physical Exam  Constitutional: He is oriented to person, place, and time. He appears well-developed and well-nourished. No distress.  Well-appearing, comfortable, cooperative  HENT:  Head: Normocephalic and atraumatic.  Mouth/Throat: Oropharynx is clear and moist.  Frontal / maxillary  sinuses non-tender. Nares patent without purulence or edema. Bilateral TMs clear without erythema, effusion or bulging, R TM partially obscured by dry cerumen. Oropharynx clear without erythema, exudates, edema or asymmetry.  Mallampati Score 1 - Complete visualization of entire oropharynx soft palate  Eyes: Pupils are equal, round, and reactive to light. Conjunctivae and EOM are normal. Right eye exhibits no discharge. Left eye exhibits no discharge.  Neck: Normal range of motion. Neck supple. No thyromegaly present.  Neck Size 16"  Cardiovascular: Normal rate, regular rhythm, normal heart sounds and intact distal pulses.   No murmur heard. Pulmonary/Chest: Effort normal and breath sounds normal. No respiratory distress. He has no wheezes. He has no rales.  Abdominal: Soft. Bowel sounds are normal. He exhibits no distension and no mass. There is no tenderness.  Musculoskeletal: Normal range of motion. He exhibits no edema or tenderness.  Upper / Lower Extremities: - Normal muscle tone, strength bilateral upper extremities 5/5, lower extremities 5/5  Lymphadenopathy:    He has no cervical adenopathy.  Neurological: He is alert and oriented to person, place, and time.  Distal sensation  intact to light touch all extremities  Skin: Skin is warm and dry. No rash noted. He is not diaphoretic. No erythema.  Psychiatric: He has a normal mood and affect. His behavior is normal.  Well groomed, good eye contact, normal speech and thoughts  Nursing note and vitals reviewed.  Results for orders placed or performed in visit on 10/17/15  Comprehensive Metabolic Panel (CMET)  Result Value Ref Range   Glucose 87 65 - 99 mg/dL   BUN 11 8 - 27 mg/dL   Creatinine, Ser 0.94 0.76 - 1.27 mg/dL   GFR calc non Af Amer 84 >59 mL/min/1.73   GFR calc Af Amer 97 >59 mL/min/1.73   BUN/Creatinine Ratio 12 10 - 24   Sodium 140 134 - 144 mmol/L   Potassium 4.8 3.5 - 5.2 mmol/L   Chloride 103 96 - 106 mmol/L   CO2 25 18 - 29 mmol/L   Calcium 9.0 8.6 - 10.2 mg/dL   Total Protein 6.2 6.0 - 8.5 g/dL   Albumin 4.2 3.6 - 4.8 g/dL   Globulin, Total 2.0 1.5 - 4.5 g/dL   Albumin/Globulin Ratio 2.1 1.2 - 2.2   Bilirubin Total 0.6 0.0 - 1.2 mg/dL   Alkaline Phosphatase 58 39 - 117 IU/L   AST 18 0 - 40 IU/L   ALT 23 0 - 44 IU/L  CBC with Differential  Result Value Ref Range   WBC 5.7 3.4 - 10.8 x10E3/uL   RBC 4.75 4.14 - 5.80 x10E6/uL   Hemoglobin 14.6 12.6 - 17.7 g/dL   Hematocrit 43.7 37.5 - 51.0 %   MCV 92 79 - 97 fL   MCH 30.7 26.6 - 33.0 pg   MCHC 33.4 31.5 - 35.7 g/dL   RDW 13.2 12.3 - 15.4 %   Platelets 267 150 - 379 x10E3/uL   Neutrophils 48 %   Lymphs 30 %   Monocytes 11 %   Eos 10 %   Basos 1 %   Neutrophils Absolute 2.7 1.4 - 7.0 x10E3/uL   Lymphocytes Absolute 1.7 0.7 - 3.1 x10E3/uL   Monocytes Absolute 0.6 0.1 - 0.9 x10E3/uL   EOS (ABSOLUTE) 0.6 (H) 0.0 - 0.4 x10E3/uL   Basophils Absolute 0.0 0.0 - 0.2 x10E3/uL   Immature Granulocytes 0 %   Immature Grans (Abs) 0.0 0.0 - 0.1 x10E3/uL  Lipid Profile  Result Value Ref Range  Cholesterol, Total 205 (H) 100 - 199 mg/dL   Triglycerides 55 0 - 149 mg/dL   HDL 80 >39 mg/dL   VLDL Cholesterol Cal 11 5 - 40 mg/dL   LDL  Calculated 114 (H) 0 - 99 mg/dL   Chol/HDL Ratio 2.6 0.0 - 5.0 ratio units      Assessment & Plan:   Problem List Items Addressed This Visit    Hyperlipidemia    Controlled cholesterol on lifestyle Last lipid panel 10/2015  Plan: 1. Briefly discussed ASCVD risk strategies 2. Check fasting lipids tomorrow with other labs 3. Will calculate ASCVD risk and determine if candidate for ASA 81 vs Statin 4. Encourage improved lifestyle - low carb/cholesterol, reduce portion size, continue improving regular exercise 5. Follow-up q 6-12 mo lipids      Hearing loss    Stable, chronic high frequency hearing loss, with hearing aids bilateral, previously followed by [facility] for Audiology      BPH associated with nocturia    Stable chronic BPH with nocturia as LUTS otherwise no significant obstruction Followed by Dr Yves Dill [facility]) - do not have record available - On OTC saw palmetto, failed Flomax (postural symptoms) - Also prior rx Finasteride but he never started - Last PSA unavailable (reported to be approx 4) - No known personal/family history of prostate CA  Plan: 1. Check PSA by request since doing blood work 2. Continue current OTC supplement with improved nocturia 3. Improve hydration 4. Follow-up with existing Urology Dr Yves Dill - will forward PSA If need, and resume routine care, may consider future finasteride but discussed that this lowers PSA needs to be appropriately monitored       Other Visit Diagnoses    Fatigue, unspecified type    -  Primary   Tiredness      Uncertain exact etiology based on history suggests more multifactorial with likely sleep hygiene, daily activities, nutritional component. Screening does not suggest OSA with two low risk screens, and no significant witnessed apnea symptoms. Clinically does not seem to be related to cardiac etiology with CP or dyspnea, not specifically exertional fatigue, seems to be more general malaise, but then again only  intermittent otherwise seems normal - Without symptoms today in office  Plan - Will check extensive lab work-up, labs placed for tomorrow 8/8 fasting, most labs related to annual physical, others added for work-up of chronic >1 year now gradual worsening fatigue - Follow-up results - Recommend Sleep Hygiene changes per AVS, improve nutrition may take a boost or protein supplement during day, regular meals avoid skipping, improve hydration, stay active, try to even limit excess sleep, may be getting too much sleep - If not improving follow-up as planned within 3 months to re-discuss symptoms, may consider other options such as cardiac with EKG / ECHO at that time if indicated - Return criteria given if more significant worsening or acute symptoms      Meds ordered this encounter  Medications  . Ascorbic Acid (VITAMIN C) 1000 MG tablet    Sig: Take by mouth.  . Cholecalciferol (VITAMIN D3) 1000 units CAPS    Sig: Take by mouth.  . vitamin B-12 (CYANOCOBALAMIN) 1000 MCG tablet    Sig: Take by mouth.  Marland Kitchen GLUCOSAMINE HCL PO    Sig: Take by mouth.  . Multiple Vitamin (MULTI-VITAMINS) TABS    Sig: Take by mouth.  . vitamin E 400 UNIT capsule    Sig: Take by mouth.    Follow up  plan: Return in about 3 months (around 04/16/2017) for Fatigue/Tiredness, lab review.  Nobie Putnam, [facility] 01/14/2017, 10:06 PM

## 2017-01-15 ENCOUNTER — Other Ambulatory Visit: Payer: Medicare Other

## 2017-01-15 ENCOUNTER — Other Ambulatory Visit: Payer: Self-pay

## 2017-01-15 DIAGNOSIS — R351 Nocturia: Secondary | ICD-10-CM

## 2017-01-15 DIAGNOSIS — E7800 Pure hypercholesterolemia, unspecified: Secondary | ICD-10-CM

## 2017-01-15 DIAGNOSIS — Z125 Encounter for screening for malignant neoplasm of prostate: Secondary | ICD-10-CM

## 2017-01-15 DIAGNOSIS — R5383 Other fatigue: Secondary | ICD-10-CM | POA: Diagnosis not present

## 2017-01-15 DIAGNOSIS — N401 Enlarged prostate with lower urinary tract symptoms: Secondary | ICD-10-CM | POA: Diagnosis not present

## 2017-01-15 DIAGNOSIS — E559 Vitamin D deficiency, unspecified: Secondary | ICD-10-CM | POA: Diagnosis not present

## 2017-01-15 DIAGNOSIS — R972 Elevated prostate specific antigen [PSA]: Secondary | ICD-10-CM

## 2017-01-15 DIAGNOSIS — R799 Abnormal finding of blood chemistry, unspecified: Secondary | ICD-10-CM

## 2017-01-15 DIAGNOSIS — Z1159 Encounter for screening for other viral diseases: Secondary | ICD-10-CM | POA: Diagnosis not present

## 2017-01-15 LAB — CBC WITH DIFFERENTIAL/PLATELET
BASOS ABS: 0 {cells}/uL (ref 0–200)
BASOS PCT: 0 %
EOS ABS: 360 {cells}/uL (ref 15–500)
Eosinophils Relative: 8 %
HEMATOCRIT: 45 % (ref 38.5–50.0)
HEMOGLOBIN: 14.8 g/dL (ref 13.2–17.1)
Lymphocytes Relative: 42 %
Lymphs Abs: 1890 cells/uL (ref 850–3900)
MCH: 30.8 pg (ref 27.0–33.0)
MCHC: 32.9 g/dL (ref 32.0–36.0)
MCV: 93.6 fL (ref 80.0–100.0)
MONO ABS: 360 {cells}/uL (ref 200–950)
MPV: 10.2 fL (ref 7.5–12.5)
Monocytes Relative: 8 %
NEUTROS ABS: 1890 {cells}/uL (ref 1500–7800)
Neutrophils Relative %: 42 %
PLATELETS: 261 10*3/uL (ref 140–400)
RBC: 4.81 MIL/uL (ref 4.20–5.80)
RDW: 13.2 % (ref 11.0–15.0)
WBC: 4.5 10*3/uL (ref 3.8–10.8)

## 2017-01-15 LAB — TSH: TSH: 1.99 m[IU]/L (ref 0.40–4.50)

## 2017-01-16 LAB — LIPID PANEL
Cholesterol: 210 mg/dL — ABNORMAL HIGH (ref <200)
HDL: 62 mg/dL (ref >40)
LDL Cholesterol: 137 mg/dL — ABNORMAL HIGH (ref <100)
TRIGLYCERIDES: 55 mg/dL (ref <150)
Total CHOL/HDL Ratio: 3.4 Ratio (ref <5.0)
VLDL: 11 mg/dL (ref <30)

## 2017-01-16 LAB — COMPLETE METABOLIC PANEL WITH GFR
ALT: 18 U/L (ref 9–46)
AST: 17 U/L (ref 10–35)
Albumin: 4.1 g/dL (ref 3.6–5.1)
Alkaline Phosphatase: 45 U/L (ref 40–115)
BUN: 10 mg/dL (ref 7–25)
CHLORIDE: 104 mmol/L (ref 98–110)
CO2: 24 mmol/L (ref 20–32)
Calcium: 9.5 mg/dL (ref 8.6–10.3)
Creat: 0.96 mg/dL (ref 0.70–1.25)
GFR, EST NON AFRICAN AMERICAN: 81 mL/min (ref >=60)
GLUCOSE: 84 mg/dL (ref 65–99)
Potassium: 4.7 mmol/L (ref 3.5–5.3)
SODIUM: 140 mmol/L (ref 135–146)
TOTAL PROTEIN: 6.6 g/dL (ref 6.1–8.1)
Total Bilirubin: 0.9 mg/dL (ref 0.2–1.2)

## 2017-01-16 LAB — TESTOSTERONE TOTAL,FREE,BIO, MALES
ALBUMIN: 4.1 g/dL (ref 3.6–5.1)
Sex Hormone Binding: 64 nmol/L (ref 22–77)
TESTOSTERONE FREE: 37.3 pg/mL — AB (ref 46.0–224.0)
TESTOSTERONE: 493 ng/dL (ref 250–827)
Testosterone, Bioavailable: 70.2 ng/dL — ABNORMAL LOW (ref 110.0–575.0)

## 2017-01-16 LAB — HEPATITIS C ANTIBODY: HCV AB: NONREACTIVE

## 2017-01-16 LAB — VITAMIN D 25 HYDROXY (VIT D DEFICIENCY, FRACTURES): Vit D, 25-Hydroxy: 25 ng/mL — ABNORMAL LOW (ref 30–100)

## 2017-01-16 LAB — HEMOGLOBIN A1C
HEMOGLOBIN A1C: 5 % (ref <5.7)
Mean Plasma Glucose: 97 mg/dL

## 2017-01-17 LAB — REFLEX PSA, FREE
PSA, % FREE: 15 % — AB (ref >25)
PSA, FREE: 0.7 ng/mL

## 2017-01-17 LAB — PSA, TOTAL WITH REFLEX TO PSA, FREE: PSA, TOTAL: 4.7 ng/mL — AB (ref <=4.0)

## 2017-01-30 ENCOUNTER — Encounter: Payer: Self-pay | Admitting: Family Medicine

## 2017-02-03 DIAGNOSIS — D485 Neoplasm of uncertain behavior of skin: Secondary | ICD-10-CM | POA: Diagnosis not present

## 2017-02-03 DIAGNOSIS — C44319 Basal cell carcinoma of skin of other parts of face: Secondary | ICD-10-CM | POA: Diagnosis not present

## 2017-04-17 ENCOUNTER — Encounter: Payer: Self-pay | Admitting: Family Medicine

## 2017-04-17 ENCOUNTER — Ambulatory Visit (INDEPENDENT_AMBULATORY_CARE_PROVIDER_SITE_OTHER): Payer: Medicare Other | Admitting: Family Medicine

## 2017-04-17 VITALS — BP 112/65 | HR 58 | Temp 97.9°F | Resp 16 | Ht 70.0 in | Wt 202.0 lb

## 2017-04-17 DIAGNOSIS — Z23 Encounter for immunization: Secondary | ICD-10-CM

## 2017-04-17 DIAGNOSIS — H8110 Benign paroxysmal vertigo, unspecified ear: Secondary | ICD-10-CM | POA: Diagnosis not present

## 2017-04-17 DIAGNOSIS — N401 Enlarged prostate with lower urinary tract symptoms: Secondary | ICD-10-CM

## 2017-04-17 DIAGNOSIS — R351 Nocturia: Secondary | ICD-10-CM

## 2017-04-17 DIAGNOSIS — R972 Elevated prostate specific antigen [PSA]: Secondary | ICD-10-CM | POA: Diagnosis not present

## 2017-04-17 DIAGNOSIS — G5702 Lesion of sciatic nerve, left lower limb: Secondary | ICD-10-CM | POA: Diagnosis not present

## 2017-04-17 MED ORDER — CYCLOBENZAPRINE HCL 10 MG PO TABS: 5.0000 mg | ORAL_TABLET | Freq: Every evening | ORAL | 0 refills | Status: DC | PRN

## 2017-04-17 MED ORDER — NAPROXEN 500 MG PO TABS: 500.0000 mg | ORAL_TABLET | Freq: Two times a day (BID) | ORAL | 0 refills | Status: DC

## 2017-04-17 NOTE — Progress Notes (Signed)
Subjective:    Patient ID: Rodney Cummings, male    DOB: 03-15-1951, 66 y.o.   MRN: [id number]  Rodney Cummings is a 66 y.o. male presenting on 04/17/2017 for Fatigue (follow up )   HPI   Left Gluteal / Piriformis Pain - Reports newer concern with L gluteal vs hip pain gradual worsening 6 weeks or more, bothering him, seems to hurt worse when lays down at night, and occasional wake him up at night. He states he quit wearing his wallet in Left back pocket a week ago, thought it was a pinched nerve when sitting on wallet driving tractor, seems to have improved some since then. Still active working on farm, limited rest. - Keeping him awake at night - Tried occasional aleve for it some relief. Not taking Tylenol, never on muscle relaxant - No prior X-ray imaging in past. 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 He tried ear drops for few days to weeks without relief. Not tried Epley maneuver. Not tried OTC meclizine or other similar medicines. - Denies fall, injury, nausea vomiting  Elevated PSA / BPH LUTS - Previously followed by [facility], last visit 07/2016, now he would like to change Urologist by preference would like referral to [facility]. His last PSA was 4.7 (01/2017). He had initial prostate biopsy in 2007 unsure exactly - His Urinary function and LUTS are actually improved now - Prior tried OTC supplement, and failed Flomax, never started Finasteride  History of fatigue - not focus today, since discussed other more acute concerns, seems stable since last visit, poor sleep attributed to above problems.  Followed by Dermatology, R facial abnormal skin lesion, reported cancer, he has follow-up for  repeat excision.  Health Maintenance: - Due for Flu Shot, will receive today  Depression screen [name] 2/9 04/17/2017 04/23/2016 10/17/2015  Decreased Interest 0 0 0  Down, Depressed, Hopeless 0 0 0  PHQ - 2 Score 0 0 0    Social History   Tobacco Use  . Smoking status: Never Smoker  . Smokeless tobacco: Current User    Types: Chew  Substance Use Topics  . Alcohol use: Yes    Alcohol/week: 0.0 oz    Comment: occasional  . Drug use: No    Review of Systems Per HPI unless specifically indicated above     Objective:    BP 112/65   Pulse (!) 58   Temp 97.9 F (36.6 C) (Oral)   Resp 16   Ht 5\' 10"  (1.778 m)   Wt 202 lb (91.6 kg)   BMI 28.98 kg/m   Wt Readings from Last 3 Encounters:  04/17/17 202 lb (91.6 kg)  01/14/17 197 lb 9.6 oz (89.6 kg)  04/23/16 207 lb (93.9 kg)    Physical Exam  Constitutional: He is oriented to person, place, and time. He appears well-developed and well-nourished. No distress.  Well-appearing, comfortable, cooperative  HENT:  Head: Normocephalic and atraumatic.  Mouth/Throat: Oropharynx is clear and moist.  Frontal / maxillary sinuses non-tender. Nares patent without purulence or edema. Bilateral TMs clear without erythema, effusion or bulging, only minimal non obstructing cerumen R ear. Oropharynx clear without erythema, exudates, edema or asymmetry.  Did not  perform Dix-Hallpike today by request  Eyes: Conjunctivae are normal. Right eye exhibits no discharge. Left eye exhibits no discharge.  Neck: Normal range of motion. Neck supple. No thyromegaly present.  Cardiovascular: Normal rate, regular rhythm, normal heart sounds and intact distal pulses.  No murmur heard. Pulmonary/Chest: Effort normal and breath sounds normal. No respiratory distress. He has no wheezes. He has no rales.  Musculoskeletal: Normal range of motion. He exhibits no edema.  Low Back / L hip and Greater Trochanter Inspection: BACK - Normal appearance, no spinal  deformity, symmetrical. HIP - Normal appearance, symmetrical, no obvious leg length or pelvis deformity  Palpation: BACK - No tenderness over spinous processes. Bilateral lumbar paraspinal muscles non-tender and without hypertonicity/spasm.  Gluteal - Left piriformis region is area of tenderness some mild spasm and discomfort on deeper palpation.  HIP - Hip and greater trochanter L non tender. Lower extremity thigh calf soft non tender no spasm.  ROM: BACK - Full active ROM forward flex / back extension, rotation L/R without discomfort HIP - Bilateral hip flex/ext supine normal, internal and external rotation normal without problem or limitation.  Special Testing: BACK - Seated SLR negative for radicular pain bilaterally HIP - FABER FADIR normal and non tender no limited movement. Acetabular compression of hip L normal  Strength: Bilateral hip flex/ext 5/5, knee flex/ext 5/5, ankle dorsiflex/plantarflex 5/5 Neurovascular: intact distal sensation to light touch  Lymphadenopathy:    He has no cervical adenopathy.  Neurological: He is alert and oriented to person, place, and time.  Skin: Skin is warm and dry. No rash noted. He is not diaphoretic. No erythema.  Psychiatric: He has a normal mood and affect. His behavior is normal.  Well groomed, good eye contact, normal speech and thoughts  Nursing note and vitals reviewed.  Results for orders placed or performed in visit on 01/30/17  HM COLONOSCOPY  Result Value Ref Range   HM Colonoscopy See Report (in chart) See Report (in chart), Patient Reported      Assessment & Plan:   Problem List Items Addressed This Visit    BPH associated with nocturia    Stable chronic BPH with nocturia as LUTS otherwise no significant obstruction Previously followed by Dr Yves Dill [facility]) - do not have record available - On OTC saw palmetto, failed Flomax (postural symptoms) - Also prior rx Finasteride but he never started - Last PSA 4.7 low  free % (01/2017). Prior Prostate Biopsy approx 2007 per Dr Yves Dill, reported benign - No known personal/family history of prostate CA  Plan: 1. Patient requesting today to change urologist from Dr Yves Dill to [facility] to establish with new Urologist for BPH and PSA monitoring by patient request 2. Continue current OTC supplement with improved nocturia 3. Improve hydration      Relevant Orders   Ambulatory referral to Urology   Elevated PSA    Previously followed by Dr Yves Dill [facility]) - do not have record available  Last PSA 4.7 low free % (01/2017). Prior Prostate Biopsy approx 2007 per Dr Yves Dill, reported benign - No known personal/family history of prostate CA  Plan: 1. Patient requesting today to change urologist from Dr Yves Dill to [facility] to establish with new Urologist for BPH and PSA monitoring by patient request      Relevant Orders   Ambulatory referral to Urology   Piriformis syndrome of left side - Primary    Subacute L gluteal / hip pain w/o sciatica. Suspected due to piriformis syndrome localized on  exam. No inciting injury, but likely provoked by riding tractor, wallet in L pocket No h/o chronic lumbar DJD. -No red flag symptoms. Negative SLR for radiculopathy -Inadequate conservative therapy   Plan: 1. Start anti-inflammatory trial with rx Naprosyn 500mg  BID wc x 2-4 weeks, then PRN 2. Start muscle relaxant with Flexeril 10mg  tabs - take 5-10mg  up to TID PRN, titrate up as tolerated - caution sedation prefer to use ONLY QHS 3. May use Tylenol PRN for breakthrough 4. Encouraged use of heating pad 1-2x daily for now then PRN 5. Avoid prolonged sitting, may take breaks if driving, given handout piriformis stretches for future 6. 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 Handout given with Epley maneuver TID for 1-2 weeks until resolved 2. Rx meclizine PRN for breakthrough symptoms 3. Return criteria, if not improved consider vestibular PT referral       Meds ordered this encounter  Medications  . Saw Palmetto 500 MG CAPS    Sig: Take by mouth.  . Omega-3 Fatty Acids (FISH OIL PO)    Sig: Take by mouth.  . cyclobenzaprine (FLEXERIL) 10 MG tablet    Sig: Take 0.5-1 tablets (5-10 mg total) at bedtime as needed by mouth for muscle spasms.    Dispense:  30 tablet    Refill:  0  . naproxen (NAPROSYN) 500 MG tablet    Sig: Take 1 tablet (500 mg total) 2 (two) times daily with a meal by mouth. For 2-4 weeks then as needed    Dispense:  60 tablet    Refill:  0    Follow up plan: Return in about 6 weeks (around 05/29/2017), or if symptoms worsen or fail to improve, for piriformis hip pain, Vertigo.  Nobie Putnam, DO [facility] 04/17/2017, 4:18 PM

## 2017-04-17 NOTE — Assessment & Plan Note (Signed)
Previously followed by Dr Yves Dill [facility]) - do not have record available  Last PSA 4.7 low free % (01/2017). Prior Prostate Biopsy approx 2007 per Dr Yves Dill, reported benign - No known personal/family history of prostate CA  Plan: 1. Patient requesting today to change urologist from Dr Yves Dill to [facility] to establish with new Urologist for BPH and PSA monitoring by patient request

## 2017-04-17 NOTE — Assessment & Plan Note (Addendum)
Subacute L gluteal / hip pain w/o sciatica. Suspected due to piriformis syndrome localized on exam. No inciting injury, but likely provoked by riding tractor, wallet in L pocket No h/o chronic lumbar DJD. -No red flag symptoms. Negative SLR for radiculopathy -Inadequate conservative therapy   Plan: 1. Start anti-inflammatory trial with rx Naprosyn 500mg  BID wc x 2-4 weeks, then PRN 2. Start muscle relaxant with Flexeril 10mg  tabs - take 5-10mg  up to TID PRN, titrate up as tolerated - caution sedation prefer to use ONLY QHS 3. May use Tylenol PRN for breakthrough 4. Encouraged use of heating pad 1-2x daily for now then PRN 5. Avoid prolonged sitting, may take breaks if driving, given handout piriformis stretches for future 6. Follow-up within 4-6 weeks if not improved or worsening, return criteria given, future may consider formal PT, hip x-rays, Gabapentin trial if lingering sciatica pain after acute flare

## 2017-04-17 NOTE — Assessment & Plan Note (Addendum)
Stable chronic BPH with nocturia as LUTS otherwise no significant obstruction Previously followed by Dr Yves Dill [facility]) - do not have record available - On OTC saw palmetto, failed Flomax (postural symptoms) - Also prior rx Finasteride but he never started - Last PSA 4.7 low free % (01/2017). Prior Prostate Biopsy approx 2007 per Dr Yves Dill, reported benign - No known personal/family history of prostate CA  Plan: 1. Patient requesting today to change urologist from Dr Yves Dill to [facility] to establish with new Urologist for BPH and PSA monitoring by patient request 2. Continue current OTC supplement with improved nocturia 3. Improve hydration

## 2017-04-17 NOTE — Patient Instructions (Addendum)
Thank you for coming to the clinic today.  1. For your Bottom / Hip Pain - this is most likely due to Muscle Spasms in your Piriformis Muscle (deeper in by hip), causing Sciatica (irritation of your sciatic nerve can cause shooting pain down your legs, sometimes with numbness and tingling).  Recommend trial of Anti-inflammatory with Naproxen (Naprosyn) 500mg  tabs - take one with food and plenty of water TWICE daily every day (breakfast and dinner), for next 2 to 4 weeks, then you may take only as needed - DO NOT TAKE any ibuprofen, aleve, motrin while you are taking this medicine - It is safe to take Tylenol Ext Str 500mg  tabs - take 1 to 2 (max dose 1000mg ) every 6 hours as needed for breakthrough pain, max 24 hour daily dose is 6 to 8 tablets or 4000mg   - Start Flexeril (cyclobenzaprine) 10mg  tablets - cut in half for 5mg  at night for muscle relaxant - may make you sedated or sleepy (be careful driving or working on this) if tolerated you can take every 8 hours, half or whole tab  Recommend to start using heating pad on your lower back 1-2x daily for few weeks  Be careful with prolonged sitting (especially if you sit on a wallet or other object this can cause flare of pain), if driving long car trip make sure to stop and take a good break to stretch legs, may find a cushion that works to ease stress of your buttocks and hip muscles.  This pain may take weeks to months to fully resolve, but hopefully it will respond to the medicine initially. All back injuries (small or serious) are slow to heal since we use our back muscles every day. Be careful with turning, twisting, lifting, sitting / standing for prolonged periods, and avoid re-injury.  If your symptoms significantly worsen with more pain, or new symptoms with weakness in one or both legs, new or different shooting leg pains, numbness in legs or groin, loss of control or retention of urine or bowel movements, please call back for advice and  you may need to go directly to the Emergency Department.   [facility] -1st floor [place],  [place]  [address] Phone: [phone]  ---------------------------  You have symptoms of Vertigo (Benign Paroxysmal Positional Vertigo) - This is commonly caused by inner ear fluid imbalance, sometimes can be worsened by allergies and sinus symptoms, otherwise it can occur randomly sometimes and we may never discover the exact cause. - To treat this, try the Epley Manuever (see diagrams/instructions below) at home up to 3 times a day for 1-2 weeks or until symptoms resolve - You may take Meclizine as needed up to 3 times a day for dizziness, this will not cure symptoms but may help. Caution may make you drowsy.  If you develop significant worsening episode with vertigo that does not improve and you get severe headache, loss of vision, arm or leg weakness, slurred speech, or other concerning symptoms please seek immediate medical attention at Emergency Department.  Please schedule a follow-up appointment with Dr Parks Ranger within 4 weeks if Vertigo not improving, and will consider Referral to Vestibular Rehab  See the next page for images describing the Epley Manuever.     ----------------------------------------------------------------------------------------------------------------------       Please schedule a Follow-up Appointment to: Return in about 6 weeks (around 05/29/2017), or if symptoms worsen or fail to improve, for  piriformis hip pain, Vertigo.  If you have any other questions or concerns, please feel free to call the clinic or send a message through [place]. You may also schedule an earlier appointment if necessary.  Additionally, you may be receiving a survey about your experience at our clinic within a few days to 1 week by e-mail or mail. We value your feedback.  Nobie Putnam,  DO [facility], [facility]     Piriformis Syndrome Rehabilitation Exercises   You may do all of these exercises right away once acute pain starts to improve on medication or treatment.  Piriformis stretch: Lying on your back with both knees bent, rest the ankle of your injured leg over the knee of your uninjured leg. Grasp the thigh of your uninjured leg and pull that knee toward your chest. You will feel a stretch along the buttocks and possibly along the outside of your hip on the injured side. Hold this for 15 to 30 seconds. Repeat 3 times.  Standing hamstring stretch: Place the heel of your leg on a stool about 15 inches high. Keep your knee straight. Lean forward, bending at the hips until you feel a mild stretch in the back of your thigh. Make sure you do not roll your shoulders and bend at the waist when doing this or you will stretch your lower back instead. Hold the stretch for 15 to 30 seconds. Repeat 3 times.  Pelvic tilt: Lie on your back with your knees bent and your feet flat on the floor. Tighten your abdominal muscles and push your lower back into the floor. Hold this position for 5 seconds, then relax. Do 3 sets of 10.  Partial curl: Lie on your back with your knees bent and your feet flat on the floor. Tighten your stomach muscles and flatten your back against the floor. Tuck your chin to your chest. With your hands stretched out in front of you, curl your upper body forward until your shoulders clear the floor. Hold this position for 3 seconds. Don't hold your breath. It helps to breathe out as you lift your shoulders up. Relax. Repeat 10 times. Build to 3 sets of 10. To challenge yourself, clasp your hands behind your head and keep your elbows out to the side.  Prone hip extension: Lie on your stomach with your legs straight out behind you. Tighten up your buttocks muscles and lift one leg off the floor about 8 inches. Keep your knee straight. Hold for 5 seconds. Then  lower your leg and relax. Do 3 sets of 10.  If both legs are affected, you may repeat this exercise for the other leg.

## 2017-04-30 ENCOUNTER — Encounter: Payer: Self-pay | Admitting: Urology

## 2017-04-30 ENCOUNTER — Ambulatory Visit (INDEPENDENT_AMBULATORY_CARE_PROVIDER_SITE_OTHER): Payer: Medicare Other | Admitting: Urology

## 2017-04-30 VITALS — BP 124/83 | HR 65 | Ht 70.0 in | Wt 195.4 lb

## 2017-04-30 DIAGNOSIS — N401 Enlarged prostate with lower urinary tract symptoms: Secondary | ICD-10-CM | POA: Diagnosis not present

## 2017-04-30 DIAGNOSIS — N4340 Spermatocele of epididymis, unspecified: Secondary | ICD-10-CM | POA: Diagnosis not present

## 2017-04-30 DIAGNOSIS — R972 Elevated prostate specific antigen [PSA]: Secondary | ICD-10-CM

## 2017-04-30 DIAGNOSIS — N138 Other obstructive and reflux uropathy: Secondary | ICD-10-CM | POA: Diagnosis not present

## 2017-04-30 LAB — MICROSCOPIC EXAMINATION
Epithelial Cells (non renal): NONE SEEN /hpf (ref 0–10)
RBC, UA: NONE SEEN /hpf (ref 0–?)

## 2017-04-30 LAB — URINALYSIS, COMPLETE
BILIRUBIN UA: NEGATIVE
GLUCOSE, UA: NEGATIVE
Leukocytes, UA: NEGATIVE
NITRITE UA: NEGATIVE
PROTEIN UA: NEGATIVE
RBC UA: NEGATIVE
Specific Gravity, UA: 1.02 (ref 1.005–1.030)
UUROB: 0.2 mg/dL (ref 0.2–1.0)
pH, UA: 6 (ref 5.0–7.5)

## 2017-04-30 LAB — BLADDER SCAN AMB NON-IMAGING

## 2017-04-30 MED ORDER — SILODOSIN 8 MG PO CAPS: 8.0000 mg | ORAL_CAPSULE | Freq: Every day | ORAL | 1 refills | Status: DC

## 2017-04-30 NOTE — Progress Notes (Signed)
04/30/2017 9:06 AM   Rodney Cummings [birth year] [id number]  Referring provider: Olin Hauser, DO [address] [place], [place] [address]  Chief Complaint  Patient presents with  . Elevated PSA    HPI: Rodney Cummings is a 66 year old male seen in consultation at the request of Dr. Parks Ranger for evaluation of lower urinary tract symptoms and an elevated PSA.  He has previously seen Dr. Eliberto Ivory and was initially having significant nocturia at 4-5 times per night.  He was started on tamsulosin which he could only tolerate for 2 weeks and had to discontinue secondary to orthostatic side effects.  He states he was told there were no other alternatives.  Over the past several months his nocturia has resolved however he does have bothersome decreased force and caliber of his urinary stream and intermittent urinary stream.  Denies dysuria or gross hematuria.  Denies flank, abdominal, pelvic or scrotal pain.  He is taking saw palmetto and was also given an Rx for finasteride by Dr. Parks Ranger however only took for 2 weeks then discontinued due to side effects of worsening voiding symptoms.  IPSS was 17/35/QOL 4/6  Past urologic history remarkable for stone disease status post endoscopic stone removal in 1986.  He also saw Dr. Madelin Headings in 2007 and underwent a prostate biopsy for a PSA in the mid 4 range.  His last PSA August 2018 was 4.7.   PMH: Past Medical History:  Diagnosis Date  . Elevated PSA   . GERD (gastroesophageal reflux disease)   . Hearing loss     Surgical History: Past Surgical History:  Procedure Laterality Date  . TONSILLECTOMY     age 49    Home Medications:  Allergies as of 04/30/2017   No Known Allergies     Medication List        Accurate as of 04/30/17  9:06 AM. Always use your most recent med list.          aspirin EC 81 MG tablet Take 81 mg by mouth daily.   GLUCOSAMINE HCL PO Take by mouth.   MULTI-VITAMINS Tabs Take by mouth.   omeprazole 40  MG capsule Commonly known as:  PRILOSEC Take 40 mg by mouth daily.   Vitamin D3 1000 units Caps Take 5,000 Units by mouth.       Allergies: No Known Allergies  Family History: Family History  Problem Relation Age of Onset  . Dementia Mother   . Cancer Father        throat/stomach  . Dementia Maternal Aunt   . Cancer Paternal Grandmother   . Cancer Paternal Grandfather   . Dementia Maternal Uncle     Social History:  reports that  has never smoked. His smokeless tobacco use includes chew. He reports that he drinks alcohol. He reports that he does not use drugs.  ROS: UROLOGY Frequent Urination?: No Hard to postpone urination?: Yes Burning/pain with urination?: No Get up at night to urinate?: No Leakage of urine?: Yes Urine stream starts and stops?: Yes Trouble starting stream?: Yes Do you have to strain to urinate?: Yes Blood in urine?: No Urinary tract infection?: No Sexually transmitted disease?: No Injury to kidneys or bladder?: No Painful intercourse?: No Weak stream?: Yes Erection problems?: Yes Penile pain?: No  Gastrointestinal Nausea?: No Vomiting?: No Indigestion/heartburn?: No Diarrhea?: No Constipation?: No  Constitutional Fever: No Night sweats?: No Weight loss?: No Fatigue?: Yes  Skin Skin rash/lesions?: Yes Itching?: No  Eyes Blurred vision?: No  Double vision?: No  Ears/Nose/Throat Sore throat?: No Sinus problems?: No  Hematologic/Lymphatic Swollen glands?: No Easy bruising?: Yes  Cardiovascular Leg swelling?: No Chest pain?: No  Respiratory Cough?: No Shortness of breath?: No  Endocrine Excessive thirst?: Yes  Musculoskeletal Back pain?: Yes Joint pain?: Yes  Neurological Headaches?: No Dizziness?: Yes  Psychologic Depression?: No Anxiety?: No  Physical Exam: BP 124/83 (BP Location: Right Arm, Patient Position: Sitting, Cuff Size: Normal)   Pulse 65   Ht 5\' 10"  (1.778 m)   Wt 195 lb 6.4 oz (88.6 kg)    BMI 28.04 kg/m   Constitutional:  Alert and oriented, No acute distress. HEENT: [place] AT, moist mucus membranes.  Trachea midline, no masses. Cardiovascular: No clubbing, cyanosis, or edema. Respiratory: Normal respiratory effort, no increased work of breathing. GI: Abdomen is soft, nontender, nondistended, no abdominal masses GU: No CVA tenderness.  Penis circumcised without lesions, testes descended bilaterally without masses or tenderness, approximately 3 cm transilluminating left supratesticular mass consistent with spermatocele.  Prostate 35 g, smooth without nodules. Skin: No rashes, bruises or suspicious lesions. Lymph: No cervical or inguinal adenopathy. Neurologic: Grossly intact, no focal deficits, moving all 4 extremities. Psychiatric: Normal mood and affect.  Laboratory Data: Lab Results  Component Value Date   WBC 4.5 01/15/2017   HGB 14.8 01/15/2017   HCT 45.0 01/15/2017   MCV 93.6 01/15/2017   PLT 261 01/15/2017    Lab Results  Component Value Date   CREATININE 0.96 01/15/2017    Lab Results  Component Value Date   TESTOSTERONE 493 01/15/2017    Lab Results  Component Value Date   HGBA1C 5.0 01/15/2017    Assessment & Plan:    1.  BPH with lower urinary tract symptoms Bothersome lower urinary tract symptoms.  Side effects with tamsulosin or not tolerable.  We discussed the lower incidence of orthostatic side effects with silodosin and Rx was sent to his pharmacy.  Follow-up 4 weeks for symptom reassessment.  PVR by bladder scan was 16 mL.  2. Elevated PSA Previous benign biopsy with stable PSA.  PSA was repeated today.  DRE was benign.  - Urinalysis, Complete - Bladder Scan (Post Void Residual) in office  3.  Left spermatocele Asymptomatic    Abbie Sons, MD  [facility] [address] [place], [place] [address] [phone]

## 2017-05-01 LAB — PSA: Prostate Specific Ag, Serum: 6.2 ng/mL — ABNORMAL HIGH (ref 0.0–4.0)

## 2017-05-04 ENCOUNTER — Other Ambulatory Visit: Payer: Self-pay | Admitting: Urology

## 2017-05-04 DIAGNOSIS — R972 Elevated prostate specific antigen [PSA]: Secondary | ICD-10-CM

## 2017-05-05 ENCOUNTER — Other Ambulatory Visit: Payer: Self-pay

## 2017-05-05 MED ORDER — DIAZEPAM 10 MG PO TABS: 10.0000 mg | ORAL_TABLET | Freq: Once | ORAL | 0 refills | Status: AC

## 2017-05-06 ENCOUNTER — Telehealth: Payer: Self-pay

## 2017-05-06 NOTE — Telephone Encounter (Signed)
-----   Message from Abbie Sons, MD sent at 05/04/2017  9:43 AM EST ----- Repeat PSA significantly elevated over baseline at 6.2.  Recommend scheduling a prostate MRI.

## 2017-05-06 NOTE — Telephone Encounter (Signed)
Results were given and MRI scheduled.

## 2017-05-13 ENCOUNTER — Ambulatory Visit (HOSPITAL_COMMUNITY): Admission: RE | Admit: 2017-05-13 | Payer: Medicare Other | Source: Ambulatory Visit

## 2017-05-14 ENCOUNTER — Ambulatory Visit (HOSPITAL_COMMUNITY): Payer: Medicare Other

## 2017-05-16 ENCOUNTER — Telehealth: Payer: Self-pay | Admitting: Family Medicine

## 2017-05-16 ENCOUNTER — Telehealth: Payer: Self-pay

## 2017-05-16 DIAGNOSIS — K2100 Gastro-esophageal reflux disease with esophagitis, without bleeding: Secondary | ICD-10-CM

## 2017-05-16 MED ORDER — OMEPRAZOLE 40 MG PO CPDR: 40.0000 mg | DELAYED_RELEASE_CAPSULE | Freq: Every day | ORAL | 3 refills | Status: DC

## 2017-05-16 NOTE — Telephone Encounter (Signed)
Pt called inquiring about PA being completed for his medication. The name of medication was not left. Attempted to call pt back and was not able to get in touch with him about the medication.

## 2017-05-16 NOTE — Telephone Encounter (Signed)
Pt called requesting omeprazole 40 mg mail order  [facility]. Pt call back  # is [phone]

## 2017-05-21 ENCOUNTER — Other Ambulatory Visit: Payer: Self-pay

## 2017-05-21 ENCOUNTER — Ambulatory Visit (HOSPITAL_COMMUNITY)
Admission: RE | Admit: 2017-05-21 | Discharge: 2017-05-21 | Disposition: A | Discharge status: Home / Self Care | Payer: Medicare Other | Source: Ambulatory Visit | Attending: Urology | Admitting: Urology

## 2017-05-21 DIAGNOSIS — R972 Elevated prostate specific antigen [PSA]: Secondary | ICD-10-CM | POA: Diagnosis not present

## 2017-05-21 DIAGNOSIS — N400 Enlarged prostate without lower urinary tract symptoms: Secondary | ICD-10-CM | POA: Insufficient documentation

## 2017-05-21 LAB — POCT I-STAT CREATININE: Creatinine, Ser: 1 mg/dL (ref 0.61–1.24)

## 2017-05-21 MED ORDER — GADOBENATE DIMEGLUMINE 529 MG/ML IV SOLN
20.0000 mL | Freq: Once | INTRAVENOUS | Status: AC | PRN
  Administered 2017-05-21: 18 mL via INTRAVENOUS

## 2017-06-05 ENCOUNTER — Ambulatory Visit (INDEPENDENT_AMBULATORY_CARE_PROVIDER_SITE_OTHER): Payer: Medicare Other | Admitting: Urology

## 2017-06-05 ENCOUNTER — Encounter: Payer: Self-pay | Admitting: Urology

## 2017-06-05 VITALS — BP 119/73 | HR 73 | Ht 72.0 in | Wt 195.0 lb

## 2017-06-05 DIAGNOSIS — N4340 Spermatocele of epididymis, unspecified: Secondary | ICD-10-CM | POA: Diagnosis not present

## 2017-06-05 DIAGNOSIS — R972 Elevated prostate specific antigen [PSA]: Secondary | ICD-10-CM

## 2017-06-05 NOTE — Progress Notes (Signed)
06/05/2017 5:54 PM   Rodney Cummings 19-Aug-1950 [id number]  Referring provider: Arlis Porta., MD No address on file  Chief Complaint  Patient presents with  . Benign Prostatic Hypertrophy    4wk     HPI: 66 year old male presents for follow-up of an elevated PSA.  He was seen November 2018 for a PSA of 6.2.  He had a previous biopsy around 2007 for a PSA in the mid 4 range.  He did have bothersome lower urinary tract symptoms and previously intolerant of tamsulosin.  Rx silodosin was given however will need prior authorization.  He did have his prostate MRI which showed a 55.8 cc prostate.  There was a PI-RADS 4 lesion in the left posterolateral peripheral zone at the mid gland to base and a PIRADS 3 lesion in the left central zone at the base.   PMH: Past Medical History:  Diagnosis Date  . Elevated PSA   . GERD (gastroesophageal reflux disease)   . Hearing loss     Surgical History: Past Surgical History:  Procedure Laterality Date  . TONSILLECTOMY     age 9    Home Medications:  Allergies as of 06/05/2017   No Known Allergies     Medication List        Accurate as of 06/05/17  5:54 PM. Always use your most recent med list.          aspirin EC 81 MG tablet Take 81 mg by mouth daily.   GLUCOSAMINE HCL PO Take by mouth.   MULTI-VITAMINS Tabs Take by mouth.   omeprazole 40 MG capsule Commonly known as:  PRILOSEC Take 1 capsule (40 mg total) by mouth daily.   silodosin 8 MG Caps capsule Commonly known as:  RAPAFLO Take 1 capsule (8 mg total) by mouth daily with breakfast.   Vitamin D3 1000 units Caps Take 5,000 Units by mouth.       Allergies: No Known Allergies  Family History: Family History  Problem Relation Age of Onset  . Dementia Mother   . Cancer Father        throat/stomach  . Dementia Maternal Aunt   . Cancer Paternal Grandmother   . Cancer Paternal Grandfather   . Dementia Maternal Uncle     Social History:  reports  that  has never smoked. His smokeless tobacco use includes chew. He reports that he drinks alcohol. He reports that he does not use drugs.  ROS: UROLOGY Frequent Urination?: No Hard to postpone urination?: Yes Burning/pain with urination?: No Get up at night to urinate?: No Leakage of urine?: No Urine stream starts and stops?: No Trouble starting stream?: Yes Do you have to strain to urinate?: No Blood in urine?: No Urinary tract infection?: No Sexually transmitted disease?: No Injury to kidneys or bladder?: No Painful intercourse?: No Weak stream?: Yes Erection problems?: No Penile pain?: No  Gastrointestinal Nausea?: No Vomiting?: No Indigestion/heartburn?: No Diarrhea?: No Constipation?: No  Constitutional Fever: No Night sweats?: No Weight loss?: No Fatigue?: Yes  Skin Skin rash/lesions?: No Itching?: No  Eyes Blurred vision?: No Double vision?: No  Ears/Nose/Throat Sore throat?: No Sinus problems?: No  Hematologic/Lymphatic Swollen glands?: No Easy bruising?: No  Cardiovascular Leg swelling?: No Chest pain?: No  Respiratory Cough?: No Shortness of breath?: No  Endocrine Excessive thirst?: No  Musculoskeletal Back pain?: No Joint pain?: Yes  Neurological Headaches?: No Dizziness?: No  Psychologic Depression?: No Anxiety?: No  Physical Exam: BP 119/73  Pulse 73   Ht 6' (1.829 m)   Wt 195 lb (88.5 kg)   BMI 26.45 kg/m   Constitutional:  Alert and oriented, No acute distress. HEENT: [place] AT, moist mucus membranes.  Trachea midline, no masses. Cardiovascular: No clubbing, cyanosis, or edema. Respiratory: Normal respiratory effort, no increased work of breathing. GI: Abdomen is soft, nontender, nondistended, no abdominal masses GU: No CVA tenderness. Skin: No rashes, bruises or suspicious lesions. Lymph: No cervical or inguinal adenopathy. Neurologic: Grossly intact, no focal deficits, moving all 4 extremities. Psychiatric: Normal  mood and affect.  Laboratory Data: Lab Results  Component Value Date   WBC 4.5 01/15/2017   HGB 14.8 01/15/2017   HCT 45.0 01/15/2017   MCV 93.6 01/15/2017   PLT 261 01/15/2017    Lab Results  Component Value Date   CREATININE 1.00 05/21/2017    Lab Results  Component Value Date   PSA1 6.2 (H) 04/30/2017    Lab Results  Component Value Date   TESTOSTERONE 493 01/15/2017    Assessment & Plan:    1. Elevated PSA We discussed the significance of a pi RADS 4 lesion and that it is suspicious for prostate cancer.  A fusion biopsy was recommended in [place] and he is agreeable to schedule.  Potential risks were discussed including bleeding and infection/sepsis.  2. Spermatocele At his last visit he was found to have a 3 cm spermatocele.  He was concerned about this lesion and requested a scrotal ultrasound. - US Scrotum; Future    Abbie Sons, [place] [address], [place], [place] [address] [phone]

## 2017-06-09 ENCOUNTER — Ambulatory Visit
Admission: RE | Admit: 2017-06-09 | Discharge: 2017-06-09 | Disposition: A | Discharge status: Home / Self Care | Payer: Medicare Other | Source: Ambulatory Visit | Attending: Urology | Admitting: Urology

## 2017-06-09 DIAGNOSIS — N503 Cyst of epididymis: Secondary | ICD-10-CM | POA: Diagnosis not present

## 2017-06-09 DIAGNOSIS — N4340 Spermatocele of epididymis, unspecified: Secondary | ICD-10-CM | POA: Insufficient documentation

## 2017-06-11 ENCOUNTER — Ambulatory Visit: Payer: Medicare Other | Admitting: Urology

## 2017-06-12 ENCOUNTER — Telehealth: Payer: Self-pay

## 2017-06-12 NOTE — Telephone Encounter (Signed)
Spoke with pt in reference to scrotal ultrasound results. Pt wanted to know if cyst was going to be removed. Please advise.

## 2017-06-12 NOTE — Telephone Encounter (Signed)
-----   Message from Abbie Sons, MD sent at 06/12/2017  9:33 AM EST ----- Scrotal ultrasound showed a benign cyst

## 2017-06-12 NOTE — Telephone Encounter (Signed)
Left pt mess to call 

## 2017-06-13 NOTE — Telephone Encounter (Signed)
I would only recc surgical removal if he was having significant symptoms

## 2017-06-17 ENCOUNTER — Other Ambulatory Visit: Payer: Self-pay

## 2017-06-17 MED ORDER — SILODOSIN 8 MG PO CAPS: 8.0000 mg | ORAL_CAPSULE | Freq: Every day | ORAL | 3 refills | Status: DC

## 2017-06-17 NOTE — Telephone Encounter (Signed)
Spoke with pt in reference surgical removal of cyst. Pt voiced understanding. Pt inquired about when he would be scheduled for fusion bx in [place]. Can you help?

## 2017-06-26 ENCOUNTER — Other Ambulatory Visit: Payer: Self-pay | Admitting: Family Medicine

## 2017-06-26 MED ORDER — ALFUZOSIN HCL ER 10 MG PO TB24: 10.0000 mg | ORAL_TABLET | Freq: Every day | ORAL | 11 refills | Status: DC

## 2017-06-27 DIAGNOSIS — Z85828 Personal history of other malignant neoplasm of skin: Secondary | ICD-10-CM | POA: Insufficient documentation

## 2017-06-27 DIAGNOSIS — C44319 Basal cell carcinoma of skin of other parts of face: Secondary | ICD-10-CM | POA: Diagnosis not present

## 2017-07-08 DIAGNOSIS — H04129 Dry eye syndrome of unspecified lacrimal gland: Secondary | ICD-10-CM | POA: Diagnosis not present

## 2017-07-08 DIAGNOSIS — H43393 Other vitreous opacities, bilateral: Secondary | ICD-10-CM | POA: Diagnosis not present

## 2017-07-08 DIAGNOSIS — H26493 Other secondary cataract, bilateral: Secondary | ICD-10-CM | POA: Diagnosis not present

## 2017-07-09 ENCOUNTER — Encounter: Payer: Self-pay | Admitting: Family Medicine

## 2017-07-09 DIAGNOSIS — C44319 Basal cell carcinoma of skin of other parts of face: Secondary | ICD-10-CM | POA: Insufficient documentation

## 2017-11-13 DIAGNOSIS — C44319 Basal cell carcinoma of skin of other parts of face: Secondary | ICD-10-CM | POA: Diagnosis not present

## 2017-11-24 ENCOUNTER — Ambulatory Visit (INDEPENDENT_AMBULATORY_CARE_PROVIDER_SITE_OTHER): Payer: Medicare Other | Admitting: Urology

## 2017-11-24 ENCOUNTER — Encounter: Payer: Self-pay | Admitting: Urology

## 2017-11-24 VITALS — BP 113/74 | HR 58 | Resp 16 | Ht 72.0 in | Wt 197.2 lb

## 2017-11-24 DIAGNOSIS — R972 Elevated prostate specific antigen [PSA]: Secondary | ICD-10-CM

## 2017-11-24 NOTE — Progress Notes (Signed)
11/24/2017 12:06 PM   Rodney Cummings Oct 31, 1950 [id number]  Referring provider: No referring provider defined for this encounter.  Chief Complaint  Patient presents with  . Follow-up    HPI: 67 year old male presents for follow-up of an elevated PSA.  He had a previous biopsy around 2007 for PSA in the mid 4 range.  He was seen for a bump in his PSA to 6.2 and a prostate MRI showed a PI-RADS 4 and PI-RADS 3 lesions.  A fusion biopsy was recommended.  The patient states he wanted to hold off on scheduling until his son got married June 15.  He has no bothersome lower urinary tract symptoms.   PMH: Past Medical History:  Diagnosis Date  . Elevated PSA   . GERD (gastroesophageal reflux disease)   . Hearing loss     Surgical History: Past Surgical History:  Procedure Laterality Date  . TONSILLECTOMY     age 66    Home Medications:  Allergies as of 11/24/2017   No Known Allergies     Medication List        Accurate as of 11/24/17 12:06 PM. Always use your most recent med list.          alfuzosin 10 MG 24 hr tablet Commonly known as:  UROXATRAL Take 1 tablet (10 mg total) by mouth daily with breakfast.   aspirin EC 81 MG tablet Take 81 mg by mouth daily.   GLUCOSAMINE HCL PO Take by mouth.   MULTI-VITAMINS Tabs Take by mouth.   omeprazole 40 MG capsule Commonly known as:  PRILOSEC Take 1 capsule (40 mg total) by mouth daily.   Vitamin D3 1000 units Caps Take 5,000 Units by mouth.       Allergies: No Known Allergies  Family History: Family History  Problem Relation Age of Onset  . Dementia Mother   . Cancer Father        throat/stomach  . Dementia Maternal Aunt   . Cancer Paternal Grandmother   . Cancer Paternal Grandfather   . Dementia Maternal Uncle     Social History:  reports that he has never smoked. His smokeless tobacco use includes chew. He reports that he drinks alcohol. He reports that he does not use  drugs.  ROS: UROLOGY Frequent Urination?: No Hard to postpone urination?: Yes Burning/pain with urination?: No Get up at night to urinate?: No Leakage of urine?: No Urine stream starts and stops?: No Trouble starting stream?: No Do you have to strain to urinate?: No Blood in urine?: No Urinary tract infection?: No Sexually transmitted disease?: No Injury to kidneys or bladder?: No Painful intercourse?: No Weak stream?: No Erection problems?: No Penile pain?: No  Gastrointestinal Nausea?: No Vomiting?: No Indigestion/heartburn?: No Diarrhea?: No Constipation?: No  Constitutional Fever: No Night sweats?: No Weight loss?: No Fatigue?: No  Skin Skin rash/lesions?: No Itching?: No  Eyes Blurred vision?: No Double vision?: No  Ears/Nose/Throat Sore throat?: No Sinus problems?: No  Hematologic/Lymphatic Swollen glands?: No Easy bruising?: No  Cardiovascular Leg swelling?: No Chest pain?: No  Respiratory Cough?: No Shortness of breath?: No  Endocrine Excessive thirst?: No  Musculoskeletal Back pain?: No Joint pain?: Yes  Neurological Headaches?: No Dizziness?: No  Psychologic Depression?: No Anxiety?: No  Physical Exam: BP 113/74   Pulse (!) 58   Resp 16   Ht 6' (1.829 m)   Wt 197 lb 3.2 oz (89.4 kg)   SpO2 96%   BMI 26.75 kg/m  Constitutional:  Alert and oriented, No acute distress. HEENT: [place] AT, moist mucus membranes.  Trachea midline, no masses. Cardiovascular: No clubbing, cyanosis, or edema. Respiratory: Normal respiratory effort, no increased work of breathing. GI: Abdomen is soft, nontender, nondistended, no abdominal masses GU: No CVA tenderness Lymph: No cervical or inguinal lymphadenopathy. Skin: No rashes, bruises or suspicious lesions. Neurologic: Grossly intact, no focal deficits, moving all 4 extremities. Psychiatric: Normal mood and affect.   Assessment & Plan:   67 year old male with an elevated PSA and PI-RADS  4/PI-RADS 3 lesions on prostate MRI.  I again discussed fusion biopsy and he is in agreement with scheduling and understands the procedure will be performed at [facility] in [place].  PSA was repeated today.   Abbie Sons, [place] [address] [place], [place] [address] [phone]

## 2017-11-25 ENCOUNTER — Telehealth: Payer: Self-pay

## 2017-11-25 LAB — PSA: PROSTATE SPECIFIC AG, SERUM: 4.2 ng/mL — AB (ref 0.0–4.0)

## 2017-11-25 NOTE — Telephone Encounter (Signed)
Called pt no answer. LM for pt informing pt of the information below. Advised pt to call back for any questions or concerns.

## 2017-11-25 NOTE — Telephone Encounter (Signed)
-----   Message from Abbie Sons, MD sent at 11/25/2017  7:48 AM EDT ----- PSA was better at 4.2

## 2017-11-26 DIAGNOSIS — L570 Actinic keratosis: Secondary | ICD-10-CM | POA: Diagnosis not present

## 2017-11-26 DIAGNOSIS — L814 Other melanin hyperpigmentation: Secondary | ICD-10-CM | POA: Diagnosis not present

## 2017-12-30 DIAGNOSIS — R972 Elevated prostate specific antigen [PSA]: Secondary | ICD-10-CM | POA: Diagnosis not present

## 2017-12-30 DIAGNOSIS — N411 Chronic prostatitis: Secondary | ICD-10-CM | POA: Diagnosis not present

## 2018-01-01 ENCOUNTER — Other Ambulatory Visit: Payer: Self-pay | Admitting: Urology

## 2018-01-07 ENCOUNTER — Ambulatory Visit (INDEPENDENT_AMBULATORY_CARE_PROVIDER_SITE_OTHER): Payer: Medicare Other | Admitting: Urology

## 2018-01-07 ENCOUNTER — Encounter: Payer: Self-pay | Admitting: Urology

## 2018-01-07 VITALS — BP 117/79 | HR 61 | Ht 72.0 in | Wt 195.3 lb

## 2018-01-07 DIAGNOSIS — R972 Elevated prostate specific antigen [PSA]: Secondary | ICD-10-CM

## 2018-01-07 NOTE — Progress Notes (Signed)
01/07/2018 10:20 AM   Rodney Cummings 20-May-1951 [id number]  Referring provider: No referring provider defined for this encounter.  Chief Complaint  Patient presents with  . Follow-up   Urologic history: 1.  Elevated PSA -Prostate biopsy ~2007 for PSA in the mid 4 range with benign pathology. -Recent PSA bumped 6.2 with MRI showing PI-RADS 3 and PI-RADS 4 lesions with prostate volume 56 g.  Fusion biopsy performed at [facility] 12/30/2017  2.  BPH with lower urinary tract symptoms; on alfuzosin  3.  History of stone disease   HPI: 67 year old male presents for fusion biopsy results.  He had no post biopsy complaints.  Pathology: Standard 12 core biopsy and targeted lesions all showed benign prostate tissue.  One core from the targeted biopsy did show focal chronic inflammatory change.   PMH: Past Medical History:  Diagnosis Date  . Elevated PSA   . GERD (gastroesophageal reflux disease)   . Hearing loss     Surgical History: Past Surgical History:  Procedure Laterality Date  . TONSILLECTOMY     age 58    Home Medications:  Allergies as of 01/07/2018   No Known Allergies     Medication List        Accurate as of 01/07/18 10:20 AM. Always use your most recent med list.          alfuzosin 10 MG 24 hr tablet Commonly known as:  UROXATRAL Take 1 tablet (10 mg total) by mouth daily with breakfast.   aspirin EC 81 MG tablet Take 81 mg by mouth daily.   GLUCOSAMINE HCL PO Take by mouth.   MULTI-VITAMINS Tabs Take by mouth.   omeprazole 40 MG capsule Commonly known as:  PRILOSEC Take 1 capsule (40 mg total) by mouth daily.   Vitamin D3 1000 units Caps Take 5,000 Units by mouth.       Allergies: No Known Allergies  Family History: Family History  Problem Relation Age of Onset  . Dementia Mother   . Cancer Father        throat/stomach  . Dementia Maternal Aunt   . Cancer Paternal Grandmother   . Cancer Paternal Grandfather   . Dementia  Maternal Uncle     Social History:  reports that he has never smoked. His smokeless tobacco use includes chew. He reports that he drinks alcohol. He reports that he does not use drugs.  ROS: UROLOGY Frequent Urination?: No Hard to postpone urination?: Yes Burning/pain with urination?: No Get up at night to urinate?: No Leakage of urine?: No Urine stream starts and stops?: No Trouble starting stream?: No Do you have to strain to urinate?: No Blood in urine?: No Urinary tract infection?: No Sexually transmitted disease?: No Injury to kidneys or bladder?: No Painful intercourse?: No Weak stream?: Yes Erection problems?: No Penile pain?: No  Gastrointestinal Nausea?: No Vomiting?: No Indigestion/heartburn?: No Diarrhea?: No Constipation?: No  Constitutional Fever: No Night sweats?: No Weight loss?: No Fatigue?: No  Skin Skin rash/lesions?: No Itching?: No  Eyes Blurred vision?: No Double vision?: No  Ears/Nose/Throat Sore throat?: No Sinus problems?: No  Hematologic/Lymphatic Swollen glands?: No Easy bruising?: No  Cardiovascular Leg swelling?: No Chest pain?: No  Respiratory Cough?: No Shortness of breath?: No  Endocrine Excessive thirst?: No  Musculoskeletal Back pain?: No Joint pain?: No  Neurological Headaches?: No Dizziness?: No  Psychologic Depression?: No Anxiety?: No  Physical Exam: BP 117/79 (BP Location: Left Arm, Patient Position: Sitting, Cuff Size: Normal)  Pulse 61   Ht 6' (1.829 m)   Wt 195 lb 4.8 oz (88.6 kg)   BMI 26.49 kg/m   Constitutional:  Alert and oriented, No acute distress.   Assessment & Plan:   Elevated PSA with benign fusion prostate biopsy.  Recommend follow-up 6 months for PSA/DRE.   Return in about 6 months (around 07/10/2018) for Recheck, PSA.  Abbie Sons, [facility] [address] [place], [place] [address] [phone]

## 2018-01-14 DIAGNOSIS — Z85828 Personal history of other malignant neoplasm of skin: Secondary | ICD-10-CM | POA: Diagnosis not present

## 2018-01-14 DIAGNOSIS — I781 Nevus, non-neoplastic: Secondary | ICD-10-CM | POA: Diagnosis not present

## 2018-01-14 DIAGNOSIS — L578 Other skin changes due to chronic exposure to nonionizing radiation: Secondary | ICD-10-CM | POA: Diagnosis not present

## 2018-01-14 DIAGNOSIS — L570 Actinic keratosis: Secondary | ICD-10-CM | POA: Diagnosis not present

## 2018-01-14 DIAGNOSIS — L821 Other seborrheic keratosis: Secondary | ICD-10-CM | POA: Diagnosis not present

## 2018-01-14 DIAGNOSIS — Z1283 Encounter for screening for malignant neoplasm of skin: Secondary | ICD-10-CM | POA: Diagnosis not present

## 2018-01-22 ENCOUNTER — Other Ambulatory Visit: Payer: Self-pay | Admitting: Urology

## 2018-03-10 ENCOUNTER — Ambulatory Visit: Payer: Medicare Other

## 2018-03-18 ENCOUNTER — Encounter: Payer: Self-pay | Admitting: Family Medicine

## 2018-03-18 ENCOUNTER — Ambulatory Visit (INDEPENDENT_AMBULATORY_CARE_PROVIDER_SITE_OTHER): Payer: Medicare Other | Admitting: Family Medicine

## 2018-03-18 VITALS — BP 101/62 | HR 54 | Temp 98.8°F | Resp 16 | Ht 72.0 in | Wt 198.0 lb

## 2018-03-18 DIAGNOSIS — E7800 Pure hypercholesterolemia, unspecified: Secondary | ICD-10-CM

## 2018-03-18 DIAGNOSIS — G5702 Lesion of sciatic nerve, left lower limb: Secondary | ICD-10-CM | POA: Diagnosis not present

## 2018-03-18 DIAGNOSIS — E559 Vitamin D deficiency, unspecified: Secondary | ICD-10-CM | POA: Diagnosis not present

## 2018-03-18 DIAGNOSIS — Z23 Encounter for immunization: Secondary | ICD-10-CM | POA: Diagnosis not present

## 2018-03-18 DIAGNOSIS — K2100 Gastro-esophageal reflux disease with esophagitis, without bleeding: Secondary | ICD-10-CM

## 2018-03-18 DIAGNOSIS — N138 Other obstructive and reflux uropathy: Secondary | ICD-10-CM | POA: Diagnosis not present

## 2018-03-18 DIAGNOSIS — R972 Elevated prostate specific antigen [PSA]: Secondary | ICD-10-CM

## 2018-03-18 DIAGNOSIS — N401 Enlarged prostate with lower urinary tract symptoms: Secondary | ICD-10-CM

## 2018-03-18 DIAGNOSIS — D518 Other vitamin B12 deficiency anemias: Secondary | ICD-10-CM | POA: Diagnosis not present

## 2018-03-18 DIAGNOSIS — R5382 Chronic fatigue, unspecified: Secondary | ICD-10-CM | POA: Diagnosis not present

## 2018-03-18 DIAGNOSIS — R799 Abnormal finding of blood chemistry, unspecified: Secondary | ICD-10-CM | POA: Diagnosis not present

## 2018-03-18 NOTE — Progress Notes (Signed)
Subjective:    Patient ID: Rodney Cummings, male    DOB: 02/13/1951, 67 y.o.   MRN: [id number]  Rodney Cummings is a 67 y.o. male presenting on 03/18/2018 for Fatigue and Gastroesophageal Reflux   HPI   FOLLOW-UP General Fatigue / Tiredness / Low Energy: Previously addressed same issue last year 2018, had extensive labs with unremarkable result for etiology of described symptoms. See prior note for background. - Today he reports persistent symptoms, without worsening or significant improvement. - He thinks may be related to arthritis and some "wear and tear" feels some soreness maybe affecting him and feels less active. He describes low back and hip pain and soreness - He does several multivitamin / supplements, herbal medications regularly with limited benefit - Not taking NSAID or tylenol regularly  Hyperlipidemia Due for repeat labs, will return for cholesterol check. - Taking ASA 81, not on statin  History vitamin D mildly low - Currently taking Vitamin D 1,000 iu daily  BPH / History of Elevated PSA Followed by Urology. Taking Rapaflo regularly.   Health Maintenance: - Colon CA Screening: Last colonoscopy 11/2013 [facility]) has report at home, was told no problem, return 10 year  Due for High Dose Flu Shot, will receive today   Depression screen [facility] 2/9 03/18/2018 04/17/2017 04/23/2016  Decreased Interest 0 0 0  Down, Depressed, Hopeless 0 0 0  PHQ - 2 Score 0 0 0    Past Medical History:  Diagnosis Date  . Elevated PSA   . GERD (gastroesophageal reflux disease)   . Hearing loss    Past Surgical History:  Procedure Laterality Date  . TONSILLECTOMY     age 34   Social History   Socioeconomic History  . Marital status: Married    Spouse name: Not on file  . Number of children: Not on file  . Years of education: Not on file  . Highest education level: Not on file  Occupational History  . Not on file  Social Needs  . Financial resource strain: Not on file    . Food insecurity:    Worry: Not on file    Inability: Not on file  . Transportation needs:    Medical: Not on file    Non-medical: Not on file  Tobacco Use  . Smoking status: Never Smoker  . Smokeless tobacco: Current User    Types: Chew  Substance and Sexual Activity  . Alcohol use: Yes    Alcohol/week: 0.0 standard drinks    Comment: occasional  . Drug use: No  . Sexual activity: Yes  Lifestyle  . Physical activity:    Days per week: Not on file    Minutes per session: Not on file  . Stress: Not on file  Relationships  . Social connections:    Talks on phone: Not on file    Gets together: Not on file    Attends religious service: Not on file    Active member of club or organization: Not on file    Attends meetings of clubs or organizations: Not on file    Relationship status: Not on file  . Intimate partner violence:    Fear of current or ex partner: Not on file    Emotionally abused: Not on file    Physically abused: Not on file    Forced sexual activity: Not on file  Other Topics Concern  . Not on file  Social History Narrative  . Not on file  Family History  Problem Relation Age of Onset  . Dementia Mother   . Cancer Father        throat/stomach  . Dementia Maternal Aunt   . Cancer Paternal Grandmother   . Cancer Paternal Grandfather   . Dementia Maternal Uncle    Current Outpatient Medications on File Prior to Visit  Medication Sig  . Cholecalciferol (VITAMIN D3) 1000 units CAPS Take 5,000 Units by mouth.   Marland Kitchen GLUCOSAMINE HCL PO Take by mouth.  . Multiple Vitamin (MULTI-VITAMINS) TABS Take by mouth.  Marland Kitchen omeprazole (PRILOSEC) 40 MG capsule Take 1 capsule (40 mg total) by mouth daily.  Marland Kitchen RAPAFLO 8 MG CAPS capsule   . alfuzosin (UROXATRAL) 10 MG 24 hr tablet Take 1 tablet (10 mg total) by mouth daily with breakfast. (Patient not taking: Reported on 03/18/2018)  . aspirin EC 81 MG tablet Take 81 mg by mouth daily.   No current facility-administered  medications on file prior to visit.     Review of Systems  Constitutional: Positive for fatigue. Negative for activity change, appetite change, chills, diaphoresis and fever.  HENT: Negative for congestion and hearing loss.   Eyes: Negative for visual disturbance.  Respiratory: Negative for apnea, cough, choking, chest tightness, shortness of breath and wheezing.   Cardiovascular: Negative for chest pain, palpitations and leg swelling.  Gastrointestinal: Negative for abdominal pain, anal bleeding, blood in stool, constipation, diarrhea, nausea and vomiting.  Endocrine: Negative for cold intolerance.  Genitourinary: Positive for difficulty urinating and frequency. Negative for decreased urine volume, dysuria, hematuria and urgency.  Musculoskeletal: Negative for arthralgias and neck pain.  Skin: Negative for rash.  Allergic/Immunologic: Negative for environmental allergies.  Neurological: Negative for dizziness, weakness, light-headedness, numbness and headaches.  Hematological: Negative for adenopathy.  Psychiatric/Behavioral: Negative for behavioral problems, dysphoric mood and sleep disturbance. The patient is not nervous/anxious.    Per HPI unless specifically indicated above     Objective:    BP 101/62   Pulse (!) 54   Temp 98.8 F (37.1 C) (Oral)   Resp 16   Ht 6' (1.829 m)   Wt 198 lb (89.8 kg)   BMI 26.85 kg/m   Wt Readings from Last 3 Encounters:  03/18/18 198 lb (89.8 kg)  01/07/18 195 lb 4.8 oz (88.6 kg)  11/24/17 197 lb 3.2 oz (89.4 kg)    Physical Exam  Constitutional: He is oriented to person, place, and time. He appears well-developed and well-nourished. No distress.  Well-appearing, comfortable, cooperative  HENT:  Head: Normocephalic and atraumatic.  Mouth/Throat: Oropharynx is clear and moist.  Eyes: Pupils are equal, round, and reactive to light. Conjunctivae and EOM are normal. Right eye exhibits no discharge. Left eye exhibits no discharge.  Neck:  Normal range of motion. Neck supple. No thyromegaly present.  Cardiovascular: Normal rate, regular rhythm, normal heart sounds and intact distal pulses.  No murmur heard. Pulmonary/Chest: Effort normal and breath sounds normal. No respiratory distress. He has no wheezes. He has no rales.  Abdominal: Soft. Bowel sounds are normal. He exhibits no distension and no mass. There is no tenderness.  Musculoskeletal: Normal range of motion. He exhibits no edema or tenderness.  Upper / Lower Extremities: - Normal muscle tone, strength bilateral upper extremities 5/5, lower extremities 5/5  Lymphadenopathy:    He has no cervical adenopathy.  Neurological: He is alert and oriented to person, place, and time.  Distal sensation intact to light touch all extremities  Skin: Skin is warm and dry.  No rash noted. He is not diaphoretic. No erythema.  Psychiatric: He has a normal mood and affect. His behavior is normal.  Well groomed, good eye contact, normal speech and thoughts  Nursing note and vitals reviewed.  Results for orders placed or performed in visit on 11/24/17  PSA  Result Value Ref Range   Prostate Specific Ag, Serum 4.2 (H) 0.0 - 4.0 ng/mL      Assessment & Plan:   Problem List Items Addressed This Visit    BPH with obstruction/lower urinary tract symptoms Stable, controlled BPH LUTS, nocturia Followed by [facility] now On Rapaflo Prior PSA mild elevated     Relevant Medications   RAPAFLO 8 MG CAPS capsule   Other Relevant Orders   CBC with Differential/Platelet   Chronic fatigue Uncertain etiology as discussed previously, prior labs negative History now more suggestive of possible underlying osteoarthritis May warrant further imaging x-ray Will add vitamin B12 based on nutrition  Trial supplement glucosamine-chondroitin Discuss additional more aggressive arthritis therapy and possibly referral if indicated at next f/u    Elevated PSA   GERD (gastroesophageal reflux  disease) Stable controlled    Relevant Orders   CBC with Differential/Platelet   Hyperlipidemia - Primary Prior elevated LDL Due for fasting lipid - calculate ASCVD risk, discuss statin therapy Cont ASA Check with next labs within 1 week    Relevant Orders   COMPLETE METABOLIC PANEL WITH GFR   Lipid panel   Piriformis syndrome of left side Likely contributing to some back pain, similar prior problem, currently stable     Other Visit Diagnoses    Needs flu shot       Relevant Orders   Flu vaccine HIGH DOSE PF (Fluzone High dose) (Completed)   Vitamin D deficiency     Continue current supplement Re-check with labs    Relevant Orders   VITAMIN D 25 Hydroxy (Vit-D Deficiency, Fractures)   Vitamin B12 deficiency (dietary) anemia     Check based on nutrition, also with history of fatigue   Relevant Orders   Vitamin B12   Abnormal blood chemistry       Relevant Orders   Hemoglobin A1c      No orders of the defined types were placed in this encounter.   Follow up plan: Return in about 3 months (around 06/18/2018) for Arthritis / Fatigue / Low Back/Hip, ?X-ray, Ortho.  Future orders placed, he will return within 1 week to get labs drawn, since did not obtain labs before visit.  Nobie Putnam, [facility] 03/18/2018, 10:17 AM

## 2018-03-18 NOTE — Patient Instructions (Addendum)
Thank you for coming to the office today.  Labs ordered for next week - we will check B12  Continue all supplements  Glucosamine joint supplement  Recommend to start taking Tylenol Extra Strength 500mg  tabs - take 1 to 2 tabs per dose (max 1000mg ) every 6-8 hours for pain (take regularly, don't skip a dose for next 7 days), max 24 hour daily dose is 6 tablets or 3000mg . In the future you can repeat the same everyday Tylenol course for 1-2 weeks at a time.   Recommend trial of Anti-inflammatory with Aleve OTC 220 or 250 mg tabs - take one to two with food and plenty of water TWICE daily every day (breakfast and dinner), for next up to 1 week, then you may take only as needed - DO NOT TAKE any ibuprofen, motrin while you are taking this medicine  In January we may consider an X-ray of Back or Hip and referral to Orthopedics if needed.  DUE for FASTING BLOOD WORK (no food or drink after midnight before the lab appointment, only water or coffee without cream/sugar on the morning of)  SCHEDULE "Lab Only" visit in the morning at the clinic for lab draw in 1 week   - Make sure Lab Only appointment is at about 1 week before your next appointment, so that results will be available  For Lab Results, once available within 2-3 days of blood draw, you can can log in to MyChart online to view your results and a brief explanation. Also, we can discuss results at next follow-up visit.   Please schedule a Follow-up Appointment to: Return in about 3 months (around 06/18/2018) for Arthritis / Fatigue / Low Back/Hip, ?X-ray, Ortho.  If you have any other questions or concerns, please feel free to call the office or send a message through [place]. You may also schedule an earlier appointment if necessary.  Additionally, you may be receiving a survey about your experience at our office within a few days to 1 week by e-mail or mail. We value your feedback.  Nobie Putnam, DO [place]

## 2018-03-19 NOTE — Addendum Note (Signed)
Addended by: Olin Hauser on: 03/19/2018 12:33 AM   Modules accepted: Level of Service

## 2018-03-23 ENCOUNTER — Other Ambulatory Visit: Payer: Medicare Other

## 2018-03-23 DIAGNOSIS — R799 Abnormal finding of blood chemistry, unspecified: Secondary | ICD-10-CM

## 2018-03-23 DIAGNOSIS — N138 Other obstructive and reflux uropathy: Secondary | ICD-10-CM

## 2018-03-23 DIAGNOSIS — K2100 Gastro-esophageal reflux disease with esophagitis, without bleeding: Secondary | ICD-10-CM

## 2018-03-23 DIAGNOSIS — D518 Other vitamin B12 deficiency anemias: Secondary | ICD-10-CM

## 2018-03-23 DIAGNOSIS — E559 Vitamin D deficiency, unspecified: Secondary | ICD-10-CM

## 2018-03-23 DIAGNOSIS — E7800 Pure hypercholesterolemia, unspecified: Secondary | ICD-10-CM | POA: Diagnosis not present

## 2018-03-23 DIAGNOSIS — N401 Enlarged prostate with lower urinary tract symptoms: Secondary | ICD-10-CM | POA: Diagnosis not present

## 2018-03-24 LAB — CBC WITH DIFFERENTIAL/PLATELET
BASOS ABS: 60 {cells}/uL (ref 0–200)
Basophils Relative: 1.5 %
EOS PCT: 14.4 %
Eosinophils Absolute: 576 cells/uL — ABNORMAL HIGH (ref 15–500)
HEMATOCRIT: 42.3 % (ref 38.5–50.0)
Hemoglobin: 14.4 g/dL (ref 13.2–17.1)
Lymphs Abs: 1692 cells/uL (ref 850–3900)
MCH: 31.2 pg (ref 27.0–33.0)
MCHC: 34 g/dL (ref 32.0–36.0)
MCV: 91.8 fL (ref 80.0–100.0)
MPV: 10.9 fL (ref 7.5–12.5)
Monocytes Relative: 10.1 %
NEUTROS PCT: 31.7 %
Neutro Abs: 1268 cells/uL — ABNORMAL LOW (ref 1500–7800)
Platelets: 237 10*3/uL (ref 140–400)
RBC: 4.61 10*6/uL (ref 4.20–5.80)
RDW: 11.6 % (ref 11.0–15.0)
Total Lymphocyte: 42.3 %
WBC: 4 10*3/uL (ref 3.8–10.8)
WBCMIX: 404 {cells}/uL (ref 200–950)

## 2018-03-24 LAB — COMPLETE METABOLIC PANEL WITH GFR
AG Ratio: 1.5 (calc) (ref 1.0–2.5)
ALT: 17 U/L (ref 9–46)
AST: 16 U/L (ref 10–35)
Albumin: 4 g/dL (ref 3.6–5.1)
Alkaline phosphatase (APISO): 44 U/L (ref 40–115)
BUN: 13 mg/dL (ref 7–25)
CALCIUM: 9.2 mg/dL (ref 8.6–10.3)
CHLORIDE: 105 mmol/L (ref 98–110)
CO2: 28 mmol/L (ref 20–32)
CREATININE: 0.89 mg/dL (ref 0.70–1.25)
GFR, EST AFRICAN AMERICAN: 102 mL/min/{1.73_m2} (ref >=60)
GFR, EST NON AFRICAN AMERICAN: 88 mL/min/{1.73_m2} (ref >=60)
GLUCOSE: 97 mg/dL (ref 65–99)
Globulin: 2.6 g/dL (calc) (ref 1.9–3.7)
Potassium: 5.2 mmol/L (ref 3.5–5.3)
Sodium: 139 mmol/L (ref 135–146)
TOTAL PROTEIN: 6.6 g/dL (ref 6.1–8.1)
Total Bilirubin: 0.8 mg/dL (ref 0.2–1.2)

## 2018-03-24 LAB — HEMOGLOBIN A1C
HEMOGLOBIN A1C: 4.9 %{Hb} (ref <5.7)
Mean Plasma Glucose: 94 (calc)
eAG (mmol/L): 5.2 (calc)

## 2018-03-24 LAB — LIPID PANEL
Cholesterol: 218 mg/dL — ABNORMAL HIGH (ref <200)
HDL: 71 mg/dL (ref >40)
LDL Cholesterol (Calc): 134 mg/dL (calc) — ABNORMAL HIGH
Non-HDL Cholesterol (Calc): 147 mg/dL (calc) — ABNORMAL HIGH (ref <130)
Total CHOL/HDL Ratio: 3.1 (calc) (ref <5.0)
Triglycerides: 43 mg/dL (ref <150)

## 2018-03-24 LAB — VITAMIN D 25 HYDROXY (VIT D DEFICIENCY, FRACTURES): VIT D 25 HYDROXY: 28 ng/mL — AB (ref 30–100)

## 2018-03-24 LAB — VITAMIN B12: VITAMIN B 12: 1407 pg/mL — AB (ref 200–1100)

## 2018-04-28 ENCOUNTER — Telehealth: Payer: Self-pay | Admitting: Family Medicine

## 2018-04-28 NOTE — Telephone Encounter (Signed)
Pt called for vitamin B12 results [phone]

## 2018-04-29 NOTE — Telephone Encounter (Signed)
Patient was notified of his Vit B results.

## 2018-06-10 HISTORY — PX: SQUAMOUS CELL CARCINOMA EXCISION: SHX2433

## 2018-06-17 ENCOUNTER — Encounter: Payer: Self-pay | Admitting: Family Medicine

## 2018-06-17 ENCOUNTER — Ambulatory Visit (INDEPENDENT_AMBULATORY_CARE_PROVIDER_SITE_OTHER): Payer: Medicare Other | Admitting: Family Medicine

## 2018-06-17 VITALS — BP 107/65 | HR 98 | Temp 98.0°F | Resp 16 | Ht 72.0 in | Wt 206.4 lb

## 2018-06-17 DIAGNOSIS — R972 Elevated prostate specific antigen [PSA]: Secondary | ICD-10-CM | POA: Diagnosis not present

## 2018-06-17 DIAGNOSIS — M150 Primary generalized (osteo)arthritis: Secondary | ICD-10-CM | POA: Diagnosis not present

## 2018-06-17 DIAGNOSIS — N401 Enlarged prostate with lower urinary tract symptoms: Secondary | ICD-10-CM | POA: Diagnosis not present

## 2018-06-17 DIAGNOSIS — M25552 Pain in left hip: Secondary | ICD-10-CM | POA: Diagnosis not present

## 2018-06-17 DIAGNOSIS — N138 Other obstructive and reflux uropathy: Secondary | ICD-10-CM

## 2018-06-17 DIAGNOSIS — G8929 Other chronic pain: Secondary | ICD-10-CM | POA: Diagnosis not present

## 2018-06-17 DIAGNOSIS — E7800 Pure hypercholesterolemia, unspecified: Secondary | ICD-10-CM

## 2018-06-17 DIAGNOSIS — M159 Polyosteoarthritis, unspecified: Secondary | ICD-10-CM

## 2018-06-17 NOTE — Assessment & Plan Note (Addendum)
Followed by [facility] Dr Bernardo Heater Improved BPH LUTS on Rapaflo instead of flomax

## 2018-06-17 NOTE — Patient Instructions (Addendum)
Thank you for coming to the office today.  X-rays of Low Back and Left Hip are ordered - come in any time next week, walk in and check in at LEFT window at front desk, they will get you registered for X-rays and end of the hall. Results called in 24 hours approximately  -----------------  Remain off the Aspirin as discussed. This seems to not be the ideal option with blood thinning for you.  You are at increased risk of future Cardiovascular complications such as Heart Attack or Stroke from an artery blockage due to abnormal cholesterol and/or risk factors. - As discussed, Statin Cholesterol pills both can both LOWER cholesterol and REDUCE this future risk of heart attack and stroke - Let me know if you are interested in considering these medications, I would recommend Rosuvastatin (generic Crestor) - potentially if it caused muscle aches and joint pains we could consider INTERMITTENT dosing only once a few times a week.   Continue to follow-up with Dr Bernardo Heater [facility]   Please schedule a Follow-up Appointment to: Return in about 3 weeks (around 07/08/2018) for Follow-up 3 weeks for Back and L Hip pain - review X-rays.  If you have any other questions or concerns, please feel free to call the office or send a message through [place]. You may also schedule an earlier appointment if necessary.  Additionally, you may be receiving a survey about your experience at our office within a few days to 1 week by e-mail or mail. We value your feedback.  Nobie Putnam, DO [place]

## 2018-06-17 NOTE — Assessment & Plan Note (Signed)
Mildly elevated LDL 130s Last lipid panel 03/2018 Calculated ASCVD 10 yr risk score mild elevated 9.1% Failed Aspirin 81mg  due to easy bleeding  Plan: 1. Discussion on ASCVD risk again and prevention - offered statin, he declines today, will remain off aspirin for now - he may reconsider low dose statin at future visit 2. Encourage improved lifestyle - low carb/cholesterol, reduce portion size, continue improving regular exercise Follow-up yearly lipids in 03/2019

## 2018-06-17 NOTE — Progress Notes (Signed)
Subjective:    Patient ID: Rodney Cummings, male    DOB: 1951/02/17, 68 y.o.   MRN: [id number]  Rodney Cummings is a 68 y.o. male presenting on 06/17/2018 for Hip Pain ( onset 4 months )   HPI   FOLLOW-UP Osteoarthritis multiple joints / LEFT Hip and Low Back Pain Known past history of joint problems with suspected arthritis from prior diagnosis and past imaging, he has history of low back pain and also primary concern is LEFT hip pain. He has prior history with me from 2018 w/ L piriformis syndrome with sciatic nerve symptoms provoked at that time, since resolved. - Today he describes similar to previous discussion, he is interested to pursue further evaluation and management of arthritis and joint pain. He states he is able to do what he needs to do but if he is more active he will feel symptoms of joint pain, primarily L hip sore and stiff, improved if rest off his feet - He admits some "popping" of left hip joint as well and in low back No further sciatica symptoms or recurrence - Tried occasional aleve for it some relief. Not taking Tylenol. Last visit for this 04/2017 he was given trial of Naproxen and Flexeril with some relief - Denies any injury, fall, trauma, numbness tingling weakness, radiating pain down leg  Follow-up Hyperlipidemia Last lab checked 03/2018 showed mild elevated LDL 134. He was started on Aspirin 81mg  as preventative dose for ASCVD after he declined to trial statin for cholesterol and risk reduction. - Today he reports he discontinued Aspirin after difficulty with easy bleeding from mild cuts and scrapes - He is not interested in new cholesterol medicine at this time.  History vitamin D mildly low - Currently taking Vitamin D 1,000 iudaily  BPH LUTS / Elevated PSA Followed by [facility], Dr Bernardo Heater He was taken off Flomax and switched to Rapaflo, now he is emptying bladder better and reduced urinary frequency. He had MRI prostate to show 2 lesions, thought to be mild,  he had negative biopsy. He is due for repeat follow-up and labs with Urology on 1/28 and 1/30.   Depression screen [facility] 2/9 06/17/2018 03/18/2018 04/17/2017  Decreased Interest 0 0 0  Down, Depressed, Hopeless 0 0 0  PHQ - 2 Score 0 0 0    Social History   Tobacco Use  . Smoking status: Never Smoker  . Smokeless tobacco: Current User    Types: Chew  Substance Use Topics  . Alcohol use: Yes    Alcohol/week: 0.0 standard drinks    Comment: occasional  . Drug use: No    Review of Systems Per HPI unless specifically indicated above     Objective:    BP 107/65   Pulse 98   Temp 98 F (36.7 C) (Oral)   Resp 16   Ht 6' (1.829 m)   Wt 206 lb 6.4 oz (93.6 kg)   SpO2 98%   BMI 27.99 kg/m   Wt Readings from Last 3 Encounters:  06/17/18 206 lb 6.4 oz (93.6 kg)  03/18/18 198 lb (89.8 kg)  01/07/18 195 lb 4.8 oz (88.6 kg)    Physical Exam Vitals signs and nursing note reviewed.  Constitutional:      General: He is not in acute distress.    Appearance: He is well-developed. He is not diaphoretic.     Comments: Well-appearing, comfortable, cooperative  HENT:     Head: Normocephalic and atraumatic.  Eyes:  General:        Right eye: No discharge.        Left eye: No discharge.     Conjunctiva/sclera: Conjunctivae normal.  Cardiovascular:     Rate and Rhythm: Normal rate.  Pulmonary:     Effort: Pulmonary effort is normal.  Musculoskeletal: Normal range of motion.     Comments: Able to ambulate without difficulty  Skin:    General: Skin is warm and dry.     Findings: No erythema or rash.  Neurological:     Mental Status: He is alert and oriented to person, place, and time.  Psychiatric:        Behavior: Behavior normal.     Comments: Well groomed, good eye contact, normal speech and thoughts    Results for orders placed or performed in visit on 03/23/18  VITAMIN D 25 Hydroxy (Vit-D Deficiency, Fractures)  Result Value Ref Range   Vit D, 25-Hydroxy 28 (L) 30 -  100 ng/mL  Vitamin B12  Result Value Ref Range   Vitamin B-12 1,407 (H) 200 - 1,100 pg/mL  Lipid panel  Result Value Ref Range   Cholesterol 218 (H) <200 mg/dL   HDL 71 >40 mg/dL   Triglycerides 43 <150 mg/dL   LDL Cholesterol (Calc) 134 (H) mg/dL (calc)   Total CHOL/HDL Ratio 3.1 <5.0 (calc)   Non-HDL Cholesterol (Calc) 147 (H) <130 mg/dL (calc)  COMPLETE METABOLIC PANEL WITH GFR  Result Value Ref Range   Glucose, Bld 97 65 - 99 mg/dL   BUN 13 7 - 25 mg/dL   Creat 0.89 0.70 - 1.25 mg/dL   GFR, Est Non African American 88 > OR = 60 mL/min/1.24m2   GFR, Est African American 102 > OR = 60 mL/min/1.35m2   BUN/Creatinine Ratio NOT APPLICABLE 6 - 22 (calc)   Sodium 139 135 - 146 mmol/L   Potassium 5.2 3.5 - 5.3 mmol/L   Chloride 105 98 - 110 mmol/L   CO2 28 20 - 32 mmol/L   Calcium 9.2 8.6 - 10.3 mg/dL   Total Protein 6.6 6.1 - 8.1 g/dL   Albumin 4.0 3.6 - 5.1 g/dL   Globulin 2.6 1.9 - 3.7 g/dL (calc)   AG Ratio 1.5 1.0 - 2.5 (calc)   Total Bilirubin 0.8 0.2 - 1.2 mg/dL   Alkaline phosphatase (APISO) 44 40 - 115 U/L   AST 16 10 - 35 U/L   ALT 17 9 - 46 U/L  CBC with Differential/Platelet  Result Value Ref Range   WBC 4.0 3.8 - 10.8 Thousand/uL   RBC 4.61 4.20 - 5.80 Million/uL   Hemoglobin 14.4 13.2 - 17.1 g/dL   HCT 42.3 38.5 - 50.0 %   MCV 91.8 80.0 - 100.0 fL   MCH 31.2 27.0 - 33.0 pg   MCHC 34.0 32.0 - 36.0 g/dL   RDW 11.6 11.0 - 15.0 %   Platelets 237 140 - 400 Thousand/uL   MPV 10.9 7.5 - 12.5 fL   Neutro Abs 1,268 (L) 1,500 - 7,800 cells/uL   Lymphs Abs 1,692 850 - 3,900 cells/uL   WBC mixed population 404 200 - 950 cells/uL   Eosinophils Absolute 576 (H) 15 - 500 cells/uL   Basophils Absolute 60 0 - 200 cells/uL   Neutrophils Relative % 31.7 %   Total Lymphocyte 42.3 %   Monocytes Relative 10.1 %   Eosinophils Relative 14.4 %   Basophils Relative 1.5 %  Hemoglobin A1c  Result Value Ref Range  Hgb A1c MFr Bld 4.9 <5.7 % of total Hgb   Mean Plasma Glucose  94 (calc)   eAG (mmol/L) 5.2 (calc)      Assessment & Plan:   Problem List Items Addressed This Visit    BPH with obstruction/lower urinary tract symptoms    Followed by [facility] Dr Bernardo Heater Improved BPH LUTS on Rapaflo instead of flomax      Chronic left hip pain   Relevant Orders   DG HIP UNILAT W OR W/O PELVIS 2-3 VIEWS LEFT   DG Lumbar Spine Complete   Elevated PSA    Followed by [facility] Urology Dr Bernardo Heater Prior elevated PSA S/p prostate biopsy and MRI Follow-up as scheduled in 06/2018 for routine follow-up / surveillance      Hyperlipidemia - Primary    Mildly elevated LDL 130s Last lipid panel 03/2018 Calculated ASCVD 10 yr risk score mild elevated 9.1% Failed Aspirin 81mg  due to easy bleeding  Plan: 1. Discussion on ASCVD risk again and prevention - offered statin, he declines today, will remain off aspirin for now - he may reconsider low dose statin at future visit 2. Encourage improved lifestyle - low carb/cholesterol, reduce portion size, continue improving regular exercise Follow-up yearly lipids in 03/2019      Primary osteoarthritis involving multiple joints   Relevant Orders   DG HIP UNILAT W OR W/O PELVIS 2-3 VIEWS LEFT   DG Lumbar Spine Complete      Subacute on chronic gradual slowly progressing problem with L Hip and low back pain Seems most consistent with osteoarthritis, provoked by activity. Also likely some tendonitis or tendinopathy in hip may have snapping hip syndrome or secondary muscle spasm related to arthritis No recent or prior hip x-ray on file - Currently, no radiation of pain or radicular symptoms or sciatica now, seems piriformis has resolved - Inadequate conservative therapy prefers to avoid medication - Previous trial NSAID Naproxen, muscle relaxant Flexeril  Plan: 1. Ordered Future X-rays - Left Hip and Lumbar Spine - patient will walk in 1-2 weeks from now to get x-rays, and then he would prefer to return sit down office visit to discuss  results and treatment options - we can review longer term NSAID, muscle relaxant again, possibly gabapentin as option for him - as well as referral to PT vs orthopedic 2nd opinion if prefer   No orders of the defined types were placed in this encounter.   Follow up plan: Return in about 3 weeks (around 07/08/2018) for Follow-up 3 weeks for Back and L Hip pain - review X-rays.  Nobie Putnam, [facility] 06/17/2018, 10:54 AM

## 2018-06-17 NOTE — Assessment & Plan Note (Signed)
Followed by [facility] Urology Dr Bernardo Heater Prior elevated PSA S/p prostate biopsy and MRI Follow-up as scheduled in 06/2018 for routine follow-up / surveillance

## 2018-07-06 ENCOUNTER — Other Ambulatory Visit: Payer: Self-pay | Admitting: Family Medicine

## 2018-07-06 DIAGNOSIS — R972 Elevated prostate specific antigen [PSA]: Secondary | ICD-10-CM

## 2018-07-07 ENCOUNTER — Ambulatory Visit
Admission: RE | Admit: 2018-07-07 | Discharge: 2018-07-07 | Disposition: A | Discharge status: Home / Self Care | Payer: Medicare Other | Source: Ambulatory Visit | Attending: Family Medicine | Admitting: Family Medicine

## 2018-07-07 ENCOUNTER — Other Ambulatory Visit: Payer: Medicare Other

## 2018-07-07 DIAGNOSIS — M47816 Spondylosis without myelopathy or radiculopathy, lumbar region: Secondary | ICD-10-CM | POA: Diagnosis not present

## 2018-07-07 DIAGNOSIS — M150 Primary generalized (osteo)arthritis: Principal | ICD-10-CM

## 2018-07-07 DIAGNOSIS — G8929 Other chronic pain: Secondary | ICD-10-CM | POA: Insufficient documentation

## 2018-07-07 DIAGNOSIS — M25552 Pain in left hip: Secondary | ICD-10-CM | POA: Diagnosis not present

## 2018-07-07 DIAGNOSIS — R972 Elevated prostate specific antigen [PSA]: Secondary | ICD-10-CM | POA: Diagnosis not present

## 2018-07-07 DIAGNOSIS — M159 Polyosteoarthritis, unspecified: Secondary | ICD-10-CM

## 2018-07-08 LAB — PSA: Prostate Specific Ag, Serum: 3.7 ng/mL (ref 0.0–4.0)

## 2018-07-09 ENCOUNTER — Encounter: Payer: Self-pay | Admitting: Urology

## 2018-07-09 ENCOUNTER — Ambulatory Visit (INDEPENDENT_AMBULATORY_CARE_PROVIDER_SITE_OTHER): Payer: Medicare Other | Admitting: Urology

## 2018-07-09 VITALS — BP 109/66 | HR 76 | Ht 72.0 in | Wt 190.0 lb

## 2018-07-09 DIAGNOSIS — N401 Enlarged prostate with lower urinary tract symptoms: Secondary | ICD-10-CM | POA: Diagnosis not present

## 2018-07-09 DIAGNOSIS — R350 Frequency of micturition: Secondary | ICD-10-CM | POA: Diagnosis not present

## 2018-07-09 DIAGNOSIS — R972 Elevated prostate specific antigen [PSA]: Secondary | ICD-10-CM | POA: Diagnosis not present

## 2018-07-09 MED ORDER — RAPAFLO 8 MG PO CAPS
8.0000 mg | ORAL_CAPSULE | Freq: Every day | ORAL | 3 refills | Status: DC
Start: 2018-07-09 — End: 2018-07-15

## 2018-07-09 NOTE — Progress Notes (Signed)
Error

## 2018-07-09 NOTE — Progress Notes (Signed)
07/09/2018  2:35 PM   Rodney Cummings [birth year] [id number]  Referring provider: Olin Hauser, DO [address] [place], [place] [address]  Chief Complaint  Patient presents with  . Elevated PSA   Urologic history: 1.  Elevated PSA  - Benign Prostate Biopsy (~2007) following mid 4 range PSA   - PSA up to 6.2 with MRI revealing PI-RADS 3/PI-RADS 4 lesions with 56-gram prostate volume.   - Fusion biopsy (12/30/2017) at [facility]. Pathology: Standard 12 core biopsy and targeted lesions all showed benign prostate tissue.  One core from the targeted biopsy did show focal chronic inflammatory change.  - Last PSA (07/07/2018) was 3.7  2.  BPH with lower urinary tract symptoms  - Currently on silodosin  3.  History of stone disease  - s/p endoscopic stone removal in 1986  HPI: Rodney Cummings is a 68 yo M who returns today for the evaluation and management of the above urologic history.   - PSA 3.7 on 07/07/2018, which is much lower than it has been  - Patient reports his symptoms have remained the same since last visit, although, he notices his frequency varies with his fluid intake  - Patient feels Rapalo has been helpful in managing his symptoms    PMH: Past Medical History:  Diagnosis Date  . Elevated PSA   . GERD (gastroesophageal reflux disease)   . Hearing loss     Surgical History: Past Surgical History:  Procedure Laterality Date  . TONSILLECTOMY     age 73    Home Medications:  Allergies as of 07/09/2018   No Known Allergies     Medication List       Accurate as of July 09, 2018  2:35 PM. Always use your most recent med list.        GLUCOSAMINE HCL PO Take by mouth.   MULTI-VITAMINS Tabs Take by mouth.   omeprazole 40 MG capsule Commonly known as:  PRILOSEC Take 1 capsule (40 mg total) by mouth daily.   RAPAFLO 8 MG Caps capsule Generic drug:  silodosin   Vitamin D3 25 MCG (1000 UT) Caps Take 5,000 Units by mouth.       Allergies: No  Known Allergies  Family History: Family History  Problem Relation Age of Onset  . Dementia Mother   . Cancer Father        throat/stomach  . Dementia Maternal Aunt   . Cancer Paternal Grandmother   . Cancer Paternal Grandfather   . Dementia Maternal Uncle     Social History:  reports that he has never smoked. His smokeless tobacco use includes chew. He reports current alcohol use. He reports that he does not use drugs.  ROS: UROLOGY Frequent Urination?: No Hard to postpone urination?: No Burning/pain with urination?: No Get up at night to urinate?: No Leakage of urine?: Yes Urine stream starts and stops?: Yes Trouble starting stream?: No Do you have to strain to urinate?: No Blood in urine?: No Urinary tract infection?: No Sexually transmitted disease?: No Injury to kidneys or bladder?: No Painful intercourse?: No Weak stream?: No Erection problems?: No Penile pain?: No  Gastrointestinal Nausea?: No Vomiting?: No Indigestion/heartburn?: No Diarrhea?: No Constipation?: No  Constitutional Fever: No Night sweats?: No Weight loss?: No Fatigue?: No  Skin Skin rash/lesions?: No Itching?: No  Eyes Blurred vision?: No Double vision?: No  Ears/Nose/Throat Sore throat?: No Sinus problems?: No  Hematologic/Lymphatic Swollen glands?: No Easy bruising?: No  Cardiovascular Leg swelling?:  No Chest pain?: No  Respiratory Cough?: No Shortness of breath?: No  Endocrine Excessive thirst?: Yes  Musculoskeletal Back pain?: Yes Joint pain?: Yes  Neurological Headaches?: No Dizziness?: No  Psychologic Depression?: No Anxiety?: No  Physical Exam: BP 109/66 (BP Location: Left Arm, Patient Position: Sitting, Cuff Size: Normal)   Pulse 76   Ht 6' (1.829 m)   Wt 190 lb (86.2 kg)   BMI 25.77 kg/m   Constitutional:  Well nourished. Alert and oriented, No acute distress. Respiratory: Normal respiratory effort, no increased work of breathing.   GU: No  CVA tenderness.  Skin: No rashes, bruises or suspicious lesions. Neurologic: Grossly intact, no focal deficits, moving all 4 extremities. Psychiatric: Normal mood and affect.  Laboratory Data: Results for Rodney, Cummings (MRN [id number]) as of 07/09/2018 14:02  Ref. Range 04/30/2017 12:50 11/24/2017 11:25 07/07/2018 10:16  Prostate Specific Ag, Serum Latest Ref Range: 0.0 - 4.0 ng/mL 6.2 (H) 4.2 (H) 3.7   Assessment & Plan:    1. Elevated PSA with benign fusion prostate biopsy  - PSA 3.7 on 07/07/2018, it is improving  - Patient feels his symptoms are well managed on Rapaflo. Plan to continue Rapaflo. Refill sent to pharmacy  -  Discussed surgical intervention options available to him including UroLift  Return in about 6 months (around 01/07/2019) for PSA and DRE.  Abbie Sons, MD [facility] [address], [place], [place] [address] [phone]  I, [phone] Rodney Cummings , am acting as a scribe for Abbie Sons, MD  I, Abbie Sons, MD, have reviewed all documentation for this visit. The documentation on 07/09/18 for the exam, diagnosis, procedures, and orders are all accurate and complete.

## 2018-07-09 NOTE — Patient Instructions (Signed)

## 2018-07-13 ENCOUNTER — Telehealth: Payer: Self-pay

## 2018-07-13 DIAGNOSIS — R350 Frequency of micturition: Principal | ICD-10-CM

## 2018-07-13 DIAGNOSIS — N401 Enlarged prostate with lower urinary tract symptoms: Secondary | ICD-10-CM

## 2018-07-13 NOTE — Telephone Encounter (Signed)
Incoming call from pt's pharmacy stating that his insurance will not cover brand name rapaflo. Does the pt need this medication to DAW for a reason.

## 2018-07-14 NOTE — Telephone Encounter (Signed)
He does not need namebrand.  Generics silodosin is fine

## 2018-07-15 ENCOUNTER — Encounter: Payer: Self-pay | Admitting: Family Medicine

## 2018-07-15 ENCOUNTER — Ambulatory Visit (INDEPENDENT_AMBULATORY_CARE_PROVIDER_SITE_OTHER): Payer: Medicare Other | Admitting: Family Medicine

## 2018-07-15 ENCOUNTER — Other Ambulatory Visit: Payer: Self-pay | Admitting: Family Medicine

## 2018-07-15 VITALS — BP 116/75 | HR 56 | Temp 97.8°F | Resp 16 | Ht 72.0 in | Wt 200.0 lb

## 2018-07-15 DIAGNOSIS — R972 Elevated prostate specific antigen [PSA]: Secondary | ICD-10-CM

## 2018-07-15 DIAGNOSIS — M159 Polyosteoarthritis, unspecified: Secondary | ICD-10-CM

## 2018-07-15 DIAGNOSIS — R7309 Other abnormal glucose: Secondary | ICD-10-CM

## 2018-07-15 DIAGNOSIS — M5136 Other intervertebral disc degeneration, lumbar region: Secondary | ICD-10-CM

## 2018-07-15 DIAGNOSIS — M25552 Pain in left hip: Secondary | ICD-10-CM | POA: Diagnosis not present

## 2018-07-15 DIAGNOSIS — G8929 Other chronic pain: Secondary | ICD-10-CM | POA: Diagnosis not present

## 2018-07-15 DIAGNOSIS — N401 Enlarged prostate with lower urinary tract symptoms: Secondary | ICD-10-CM

## 2018-07-15 DIAGNOSIS — M51369 Other intervertebral disc degeneration, lumbar region without mention of lumbar back pain or lower extremity pain: Secondary | ICD-10-CM

## 2018-07-15 DIAGNOSIS — E7800 Pure hypercholesterolemia, unspecified: Secondary | ICD-10-CM

## 2018-07-15 DIAGNOSIS — K2100 Gastro-esophageal reflux disease with esophagitis, without bleeding: Secondary | ICD-10-CM

## 2018-07-15 DIAGNOSIS — M150 Primary generalized (osteo)arthritis: Secondary | ICD-10-CM

## 2018-07-15 DIAGNOSIS — N138 Other obstructive and reflux uropathy: Secondary | ICD-10-CM

## 2018-07-15 DIAGNOSIS — E559 Vitamin D deficiency, unspecified: Secondary | ICD-10-CM

## 2018-07-15 MED ORDER — SILODOSIN 8 MG PO CAPS: 8.0000 mg | ORAL_CAPSULE | Freq: Every day | ORAL | 3 refills | Status: DC

## 2018-07-15 NOTE — Telephone Encounter (Signed)
New rx sent in for the generic

## 2018-07-15 NOTE — Patient Instructions (Addendum)
Thank you for coming to the office today.  Stay tuned on X-ray result CD.  Let me know in future if you want to try arthritis medicine in future such as anti inflammatory rx or gabapentin for nerve pain.  --------------  DUE for FASTING BLOOD WORK (no food or drink after midnight before the lab appointment, only water or coffee without cream/sugar on the morning of)  SCHEDULE "Lab Only" visit in the morning at the clinic for lab draw in 8 MONTHS   - Make sure Lab Only appointment is at about 1 week before your next appointment, so that results will be available  For Lab Results, once available within 2-3 days of blood draw, you can can log in to MyChart online to view your results and a brief explanation. Also, we can discuss results at next follow-up visit.    X-rays from 07/07/18.  CLINICAL DATA:  Chronic intermittent low back pain, worsened over the past 6 months.  EXAM: LUMBAR SPINE - COMPLETE 4+ VIEW  COMPARISON:  None.  FINDINGS: Five lumbar type vertebral bodies. No acute fracture or subluxation. Vertebral body heights are preserved. Trace retrolisthesis at L3-L4. Mild disc height loss at L4-L5. Moderate disc height loss at L5-S1. Mild bilateral facet arthropathy at L4-L5 and L5-S1. The sacroiliac joints are unremarkable. Aortoiliac atherosclerosis.  IMPRESSION: 1. 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   Electronically Signed   By: Titus Dubin M.D.   On: 07/07/2018 14:47  ----------------------------------------------------  CLINICAL DATA:  Chronic left hip pain.  EXAM: DG HIP (WITH OR WITHOUT PELVIS) 2-3V LEFT  COMPARISON:  None.  FINDINGS: There is no evidence of hip fracture or dislocation. There is no evidence of arthropathy or other focal bone abnormality.  IMPRESSION: Negative.   Electronically Signed   By: Titus Dubin M.D.   On: 07/07/2018 14:46  Please schedule a Follow-up Appointment to: Return in about 8 months  (around 03/15/2019) for Yearly Medicare Checkup.  If you have any other questions or concerns, please feel free to call the office or send a message through [facility]. You may also schedule an earlier appointment if necessary.  Additionally, you may be receiving a survey about your experience at our office within a few days to 1 week by e-mail or mail. We value your feedback.  Nobie Putnam, DO [place]

## 2018-07-15 NOTE — Progress Notes (Signed)
Subjective:    Patient ID: Rodney Cummings, male    DOB: 1951-01-24, 68 y.o.   MRN: [id number]  Rodney Cummings is a 68 y.o. male presenting on 07/15/2018 for Sciatica (mostly at night on left side onset 6 month)   HPI  FOLLOW-UPOsteoarthritis multiple joints / LEFT Hip and Low Back Pain Known past history of joint problems with suspected arthritis from prior diagnosis and past imaging, he has history of low back pain and also primary concern is LEFT hip pain. Prior sciatica and piriformis L syndrome. - Last visit evaluated this problem 06/17/18 - Interval, patient had X-rays of Lumbar Spine and Left Hip on 1/28, he is here now to review x-rays  Today he reports overall symptoms are relatively stable. He wants to learn more about arthritis and what to do in future. He describes has some low back pain and stiff, with soreness into L hip at times, now describes some episodes of radiating pain into Left gluteal region into lower leg on back of leg, L >R but someon both side  - He admits some "popping" of left hip joint as well and in low back - He is taking joint supplement with some benefit for past 3 weeks, believes it is glucosamine / chondroitin - He has benefited from chiropractor Dr Olen Cordial locally in past with good results, asking if he should return now  - Tried occasional aleve for it some relief. Not taking Tylenol. In past has tried Flexeril PRN, Naproxen  - Denies any injury, fall, trauma, numbness tingling weakness   Depression screen [facility] 2/9 07/15/2018 06/17/2018 03/18/2018  Decreased Interest 0 0 0  Down, Depressed, Hopeless 0 0 0  PHQ - 2 Score 0 0 0    Social History   Tobacco Use  . Smoking status: Never Smoker  . Smokeless tobacco: Current User    Types: Chew  Substance Use Topics  . Alcohol use: Yes    Alcohol/week: 0.0 standard drinks    Comment: "beer on weekends"   . Drug use: No    Review of Systems Per HPI unless specifically indicated above     Objective:      BP 116/75   Pulse (!) 56   Temp 97.8 F (36.6 C) (Oral)   Resp 16   Ht 6' (1.829 m)   Wt 200 lb (90.7 kg)   BMI 27.12 kg/m   Wt Readings from Last 3 Encounters:  07/15/18 200 lb (90.7 kg)  07/09/18 190 lb (86.2 kg)  06/17/18 206 lb 6.4 oz (93.6 kg)    Physical Exam Vitals signs and nursing note reviewed.  Constitutional:      General: He is not in acute distress.    Appearance: He is well-developed. He is not diaphoretic.     Comments: Well-appearing, comfortable, cooperative  HENT:     Head: Normocephalic and atraumatic.  Eyes:     General:        Right eye: No discharge.        Left eye: No discharge.     Conjunctiva/sclera: Conjunctivae normal.  Cardiovascular:     Rate and Rhythm: Normal rate.  Pulmonary:     Effort: Pulmonary effort is normal.  Musculoskeletal:     Comments: Low Back / Left hip and Greater Trochanter Inspection: BACK - Normal appearance, no spinal deformity, symmetrical. HIP - Normal appearance, symmetrical, no obvious leg length or pelvis deformity  Palpation: BACK - No tenderness over spinous processes. Bilateral lumbar paraspinal  muscles non-tender. With some hypertonicity of paraspinal muscles in SI region  HIP - L hip non tender on exam.  ROM: BACK - Full active ROM forward flex / back extension, rotation L/R without discomfort HIP - Bilateral hip flex/ext supine normal, internal and external rotation normal without problem or limitation.  Special Testing: BACK - Seated SLR negative for radicular pain bilaterally - some reproduced pain in left low back into hip but no reproduced radicular symptoms. HIP - FABER FADIR normal and non tender no limited movement.  Strength: Bilateral hip flex/ext 5/5, knee flex/ext 5/5, ankle dorsiflex/plantarflex 5/5 Neurovascular: intact distal sensation to light touch   Skin:    General: Skin is warm and dry.     Findings: No erythema or rash.  Neurological:     Mental Status: He is alert and  oriented to person, place, and time.  Psychiatric:        Behavior: Behavior normal.     Comments: Well groomed, good eye contact, normal speech and thoughts      I have personally reviewed the radiology report from 07/07/18 Left Hip and Lumbar Spine.  CLINICAL DATA:  Chronic intermittent low back pain, worsened over the past 6 months.  EXAM: LUMBAR SPINE - COMPLETE 4+ VIEW  COMPARISON:  None.  FINDINGS: Five lumbar type vertebral bodies. No acute fracture or subluxation. Vertebral body heights are preserved. Trace retrolisthesis at L3-L4. Mild disc height loss at L4-L5. Moderate disc height loss at L5-S1. Mild bilateral facet arthropathy at L4-L5 and L5-S1. The sacroiliac joints are unremarkable. Aortoiliac atherosclerosis.  IMPRESSION: 1. 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   Electronically Signed   By: Titus Dubin M.D.   On: 07/07/2018 14:47  ----------------------------------------------------  CLINICAL DATA:  Chronic left hip pain.  EXAM: DG HIP (WITH OR WITHOUT PELVIS) 2-3V LEFT  COMPARISON:  None.  FINDINGS: There is no evidence of hip fracture or dislocation. There is no evidence of arthropathy or other focal bone abnormality.  IMPRESSION: Negative.   Electronically Signed   By: Titus Dubin M.D.   On: 07/07/2018 14:46  Results for orders placed or performed in visit on 07/07/18  PSA  Result Value Ref Range   Prostate Specific Ag, Serum 3.7 0.0 - 4.0 ng/mL      Assessment & Plan:   Problem List Items Addressed This Visit    Chronic left hip pain   DDD (degenerative disc disease), lumbar   Primary osteoarthritis involving multiple joints - Primary      Subacute on chronic L LBP with associated L>R sciatica Without known injury or trauma. In setting of known chronic LBP with DDD. No prior surgery. - No red flag symptoms. Has radicular symptoms but not clearly reproduced today - Inadequate conservative therapy. In  past did well with trial on NSAID higher dose and Muscle relaxant - X-rays Lumbar Spine / L hip reviewed, 07/07/18  - no significant degenerative arthritis of Left hip. Primarily symptoms coming from lumbar DDD it seems.  Plan: 1. Discussed all x-ray results in person and reviewed images today - CD of images given to patient today to bring to chiropractor  - Ultimately he declines any long term arthritis medications rx now - he wants to modify lifestyle, try chiropractor, continue supplement - Reviewed routine recommendations for OA/DDD back pain - May try OTC Tylenol, NSAID PRN - Encouraged use of heating pad 1-2x daily for now then PRN - Stretching / regular activity, avoid overuse  Follow-up as needed - if flare  consider stronger rx NSAID, muscle relaxant vs prednisone taper, also offer future refer to Physical Therapy vs Orthopedic if indicated  No orders of the defined types were placed in this encounter.   Follow up plan: Return in about 8 months (around 03/15/2019) for Yearly Medicare Checkup.  Future labs ordered for 03/10/19  Nobie Putnam, [facility] 07/15/2018, 8:49 AM

## 2018-10-29 ENCOUNTER — Other Ambulatory Visit: Payer: Self-pay | Admitting: Family Medicine

## 2018-10-29 DIAGNOSIS — K2100 Gastro-esophageal reflux disease with esophagitis, without bleeding: Secondary | ICD-10-CM

## 2018-10-29 MED ORDER — OMEPRAZOLE 40 MG PO CPDR: 40.0000 mg | DELAYED_RELEASE_CAPSULE | Freq: Every day | ORAL | 3 refills | Status: DC

## 2018-10-29 NOTE — Telephone Encounter (Signed)
Wife called requesting refill on omeprazole  Mail order

## 2019-01-04 DIAGNOSIS — C44622 Squamous cell carcinoma of skin of right upper limb, including shoulder: Secondary | ICD-10-CM | POA: Diagnosis not present

## 2019-01-04 DIAGNOSIS — L814 Other melanin hyperpigmentation: Secondary | ICD-10-CM | POA: Diagnosis not present

## 2019-01-04 DIAGNOSIS — D485 Neoplasm of uncertain behavior of skin: Secondary | ICD-10-CM | POA: Diagnosis not present

## 2019-01-05 ENCOUNTER — Other Ambulatory Visit: Payer: Self-pay | Admitting: *Deleted

## 2019-01-05 DIAGNOSIS — R972 Elevated prostate specific antigen [PSA]: Secondary | ICD-10-CM

## 2019-01-06 ENCOUNTER — Other Ambulatory Visit: Payer: Self-pay

## 2019-01-06 ENCOUNTER — Other Ambulatory Visit: Payer: Medicare Other

## 2019-01-06 DIAGNOSIS — R972 Elevated prostate specific antigen [PSA]: Secondary | ICD-10-CM

## 2019-01-07 LAB — PSA: Prostate Specific Ag, Serum: 4.1 ng/mL — ABNORMAL HIGH (ref 0.0–4.0)

## 2019-01-08 ENCOUNTER — Ambulatory Visit (INDEPENDENT_AMBULATORY_CARE_PROVIDER_SITE_OTHER): Payer: Medicare Other | Admitting: Urology

## 2019-01-08 ENCOUNTER — Other Ambulatory Visit: Payer: Self-pay

## 2019-01-08 ENCOUNTER — Encounter: Payer: Self-pay | Admitting: Urology

## 2019-01-08 VITALS — BP 107/66 | HR 62 | Ht 72.0 in | Wt 189.2 lb

## 2019-01-08 DIAGNOSIS — R972 Elevated prostate specific antigen [PSA]: Secondary | ICD-10-CM | POA: Diagnosis not present

## 2019-01-08 DIAGNOSIS — N401 Enlarged prostate with lower urinary tract symptoms: Secondary | ICD-10-CM

## 2019-01-08 DIAGNOSIS — R350 Frequency of micturition: Secondary | ICD-10-CM | POA: Diagnosis not present

## 2019-01-08 MED ORDER — SILODOSIN 8 MG PO CAPS: 8.0000 mg | ORAL_CAPSULE | Freq: Every day | ORAL | 3 refills | Status: DC

## 2019-01-08 NOTE — Progress Notes (Signed)
01/08/2019 3:29 PM   Rodney Cummings [birth year] [id number]  Referring provider: Olin Hauser, DO [address] [place],  [place] [address]  Chief Complaint  Patient presents with  . Elevated PSA    Urologic history: 1.Elevated PSA             - Benign Prostate Biopsy (~2007) following mid 4 range PSA              - PSA up to 6.2 with MRI revealing PI-RADS 3/PI-RADS 4 lesions with 56-gram prostate volume.              - Fusion biopsy (12/30/2017) at [facility]. Pathology: Standard 12 core biopsy and targeted lesions all showed benign prostate tissue. One core from the targeted biopsy did show focal chronic inflammatory change.             - Last PSA (07/07/2018) was 3.7  2.BPH with lower urinary tract symptoms            - Currently on silodosin  3.History of stone disease             - s/p endoscopic stone removal in 1986  HPI: [name] presents for semiannual follow-up of the above problem list. Since his visit last year he has no complaints.  His voiding pattern is stable on silodosin.  Denies dysuria or gross hematuria.  Denies flank, abdominal, pelvic or scrotal pain.  PSA drawn on 01/06/2019 was stable at 4.1.   PMH: Past Medical History:  Diagnosis Date  . Elevated PSA   . GERD (gastroesophageal reflux disease)   . Hearing loss     Surgical History: Past Surgical History:  Procedure Laterality Date  . TONSILLECTOMY     age 68    Home Medications:  Allergies as of 01/08/2019   No Known Allergies     Medication List       Accurate as of January 08, 2019  3:29 PM. If you have any questions, ask your nurse or doctor.        GLUCOSAMINE HCL PO Take by mouth.   Multi-Vitamins Tabs Take by mouth.   omeprazole 40 MG capsule Commonly known as: PRILOSEC Take 1 capsule (40 mg total) by mouth daily.   silodosin 8 MG Caps capsule Commonly known as: RAPAFLO Take 1 capsule (8 mg total) by mouth daily with breakfast.   Vitamin D3 25 MCG (1000 UT)  Caps Take 5,000 Units by mouth.       Allergies: No Known Allergies  Family History: Family History  Problem Relation Age of Onset  . Dementia Mother   . Cancer Father        throat/stomach  . Dementia Maternal Aunt   . Cancer Paternal Grandmother   . Cancer Paternal Grandfather   . Dementia Maternal Uncle     Social History:  reports that he has never smoked. His smokeless tobacco use includes chew. He reports current alcohol use. He reports that he does not use drugs.  ROS: UROLOGY Frequent Urination?: No Hard to postpone urination?: No Burning/pain with urination?: No Get up at night to urinate?: No Leakage of urine?: No Urine stream starts and stops?: No Trouble starting stream?: No Do you have to strain to urinate?: No Blood in urine?: No Urinary tract infection?: No Sexually transmitted disease?: No Injury to kidneys or bladder?: No Painful intercourse?: No Weak stream?: No Erection problems?: No Penile pain?: No  Gastrointestinal Nausea?: No Vomiting?: No Indigestion/heartburn?: No  Diarrhea?: No Constipation?: No  Constitutional Fever: No Night sweats?: No Weight loss?: No Fatigue?: No  Skin Skin rash/lesions?: No Itching?: No  Eyes Blurred vision?: No Double vision?: No  Ears/Nose/Throat Sore throat?: No Sinus problems?: No  Hematologic/Lymphatic Swollen glands?: No Easy bruising?: No  Cardiovascular Leg swelling?: No Chest pain?: No  Respiratory Cough?: No Shortness of breath?: No  Endocrine Excessive thirst?: No  Musculoskeletal Back pain?: No Joint pain?: No  Neurological Headaches?: No Dizziness?: No  Psychologic Depression?: No Anxiety?: No  Physical Exam: BP 107/66 (BP Location: Left Arm, Patient Position: Sitting, Cuff Size: Normal)   Pulse 62   Ht 6' (1.829 m)   Wt 189 lb 3.2 oz (85.8 kg)   BMI 25.66 kg/m   Constitutional:  Alert and oriented, No acute distress. HEENT: [place] AT, moist mucus membranes.   Trachea midline, no masses. Cardiovascular: No clubbing, cyanosis, or edema. Respiratory: Normal respiratory effort, no increased work of breathing. GI: Abdomen is soft, nontender, nondistended, no abdominal masses GU: No CVA tenderness.  Prostate 50 g, smooth without nodules Lymph: No cervical or inguinal lymphadenopathy. Skin: No rashes, bruises or suspicious lesions. Neurologic: Grossly intact, no focal deficits, moving all 4 extremities. Psychiatric: Normal mood and affect.   Assessment & Plan:    - Elevated PSA Stable PSA and DRE.  We will continue to monitor and move to a six-month follow-up for lab visit/PSA and annual follow-up for PSA DRE.  - BPH with lower urinary tract symptoms Doing well on silodosin which was refilled.  Return in about 1 year (around 01/08/2020) for Recheck, PSA.   Abbie Sons, [place] [address] [place], [place] [address] [phone]

## 2019-01-13 ENCOUNTER — Telehealth: Payer: Self-pay | Admitting: Urology

## 2019-01-13 DIAGNOSIS — N401 Enlarged prostate with lower urinary tract symptoms: Secondary | ICD-10-CM

## 2019-01-13 MED ORDER — SILODOSIN 8 MG PO CAPS: 8.0000 mg | ORAL_CAPSULE | Freq: Every day | ORAL | 3 refills | Status: DC

## 2019-01-13 NOTE — Telephone Encounter (Signed)
Pt's wife called lm on vm that we called his 40 into the wrong pharmacy. Should [facility]. Can we change it?   Thanks  [employer]

## 2019-01-13 NOTE — Telephone Encounter (Signed)
Rapaflo sent to [facility] per patient request

## 2019-01-26 DIAGNOSIS — C44622 Squamous cell carcinoma of skin of right upper limb, including shoulder: Secondary | ICD-10-CM | POA: Diagnosis not present

## 2019-03-10 ENCOUNTER — Other Ambulatory Visit: Payer: Self-pay

## 2019-03-10 ENCOUNTER — Other Ambulatory Visit: Payer: Medicare Other

## 2019-03-10 DIAGNOSIS — K2100 Gastro-esophageal reflux disease with esophagitis, without bleeding: Secondary | ICD-10-CM

## 2019-03-10 DIAGNOSIS — M5136 Other intervertebral disc degeneration, lumbar region: Secondary | ICD-10-CM | POA: Diagnosis not present

## 2019-03-10 DIAGNOSIS — E559 Vitamin D deficiency, unspecified: Secondary | ICD-10-CM | POA: Diagnosis not present

## 2019-03-10 DIAGNOSIS — E7800 Pure hypercholesterolemia, unspecified: Secondary | ICD-10-CM

## 2019-03-10 DIAGNOSIS — R7309 Other abnormal glucose: Secondary | ICD-10-CM

## 2019-03-11 LAB — COMPLETE METABOLIC PANEL WITH GFR
AG Ratio: 1.7 (calc) (ref 1.0–2.5)
ALT: 24 U/L (ref 9–46)
AST: 19 U/L (ref 10–35)
Albumin: 3.9 g/dL (ref 3.6–5.1)
Alkaline phosphatase (APISO): 42 U/L (ref 35–144)
BUN: 16 mg/dL (ref 7–25)
CO2: 29 mmol/L (ref 20–32)
Calcium: 9 mg/dL (ref 8.6–10.3)
Chloride: 107 mmol/L (ref 98–110)
Creat: 0.95 mg/dL (ref 0.70–1.25)
GFR, Est African American: 94 mL/min/{1.73_m2} (ref >=60)
GFR, Est Non African American: 81 mL/min/{1.73_m2} (ref >=60)
Globulin: 2.3 g/dL (calc) (ref 1.9–3.7)
Glucose, Bld: 75 mg/dL (ref 65–99)
Potassium: 4.6 mmol/L (ref 3.5–5.3)
Sodium: 140 mmol/L (ref 135–146)
Total Bilirubin: 0.4 mg/dL (ref 0.2–1.2)
Total Protein: 6.2 g/dL (ref 6.1–8.1)

## 2019-03-11 LAB — CBC WITH DIFFERENTIAL/PLATELET
Absolute Monocytes: 385 cells/uL (ref 200–950)
Basophils Absolute: 71 cells/uL (ref 0–200)
Basophils Relative: 1.5 %
Eosinophils Absolute: 475 cells/uL (ref 15–500)
Eosinophils Relative: 10.1 %
HCT: 43 % (ref 38.5–50.0)
Hemoglobin: 13.9 g/dL (ref 13.2–17.1)
Lymphs Abs: 2026 cells/uL (ref 850–3900)
MCH: 30.8 pg (ref 27.0–33.0)
MCHC: 32.3 g/dL (ref 32.0–36.0)
MCV: 95.3 fL (ref 80.0–100.0)
MPV: 10.4 fL (ref 7.5–12.5)
Monocytes Relative: 8.2 %
Neutro Abs: 1744 cells/uL (ref 1500–7800)
Neutrophils Relative %: 37.1 %
Platelets: 247 10*3/uL (ref 140–400)
RBC: 4.51 10*6/uL (ref 4.20–5.80)
RDW: 11.7 % (ref 11.0–15.0)
Total Lymphocyte: 43.1 %
WBC: 4.7 10*3/uL (ref 3.8–10.8)

## 2019-03-11 LAB — VITAMIN D 25 HYDROXY (VIT D DEFICIENCY, FRACTURES): Vit D, 25-Hydroxy: 36 ng/mL (ref 30–100)

## 2019-03-11 LAB — LIPID PANEL
Cholesterol: 203 mg/dL — ABNORMAL HIGH (ref <200)
HDL: 69 mg/dL (ref >=40)
LDL Cholesterol (Calc): 120 mg/dL (calc) — ABNORMAL HIGH
Non-HDL Cholesterol (Calc): 134 mg/dL (calc) — ABNORMAL HIGH (ref <130)
Total CHOL/HDL Ratio: 2.9 (calc) (ref <5.0)
Triglycerides: 46 mg/dL (ref <150)

## 2019-03-11 LAB — HEMOGLOBIN A1C
Hgb A1c MFr Bld: 4.9 % of total Hgb (ref <5.7)
Mean Plasma Glucose: 94 (calc)
eAG (mmol/L): 5.2 (calc)

## 2019-03-16 ENCOUNTER — Other Ambulatory Visit: Payer: Self-pay

## 2019-03-16 ENCOUNTER — Ambulatory Visit (INDEPENDENT_AMBULATORY_CARE_PROVIDER_SITE_OTHER): Payer: Medicare Other | Admitting: Family Medicine

## 2019-03-16 ENCOUNTER — Encounter: Payer: Self-pay | Admitting: Family Medicine

## 2019-03-16 VITALS — BP 95/66 | HR 60 | Ht 72.0 in | Wt 193.6 lb

## 2019-03-16 DIAGNOSIS — H60543 Acute eczematoid otitis externa, bilateral: Secondary | ICD-10-CM

## 2019-03-16 DIAGNOSIS — Z23 Encounter for immunization: Secondary | ICD-10-CM

## 2019-03-16 DIAGNOSIS — R350 Frequency of micturition: Secondary | ICD-10-CM

## 2019-03-16 DIAGNOSIS — E7800 Pure hypercholesterolemia, unspecified: Secondary | ICD-10-CM

## 2019-03-16 DIAGNOSIS — M159 Polyosteoarthritis, unspecified: Secondary | ICD-10-CM

## 2019-03-16 DIAGNOSIS — E663 Overweight: Secondary | ICD-10-CM | POA: Insufficient documentation

## 2019-03-16 DIAGNOSIS — M8949 Other hypertrophic osteoarthropathy, multiple sites: Secondary | ICD-10-CM

## 2019-03-16 DIAGNOSIS — N401 Enlarged prostate with lower urinary tract symptoms: Secondary | ICD-10-CM

## 2019-03-16 MED ORDER — HYDROCORTISONE-ACETIC ACID 1-2 % OT SOLN: 3.0000 [drp] | Freq: Three times a day (TID) | OTIC | 0 refills | Status: DC

## 2019-03-16 NOTE — Assessment & Plan Note (Signed)
Encourage improvement lifestyle for wt loss 

## 2019-03-16 NOTE — Assessment & Plan Note (Signed)
Mildly elevated LDL 120s now miproved Last lipid panel 02/2019 Calculated ASCVD 10 yr risk score mild elevated 9.1% Failed Aspirin 81mg  due to easy bleeding  Plan: 1. Discussion on ASCVD risk again and prevention - offered statin, he declines today, will remain off aspirin for now - he may reconsider low dose statin at future visit 2. Encourage improved lifestyle - low carb/cholesterol, reduce portion size, continue improving regular exercise Follow-up yearly lipids

## 2019-03-16 NOTE — Progress Notes (Signed)
Subjective:    Patient ID: Rodney Cummings, male    DOB: 11-Nov-1950, 68 y.o.   MRN: [id number]  Rodney Cummings is a 68 y.o. male presenting on 03/16/2019 for Osteoarthritis   HPI   FOLLOW-UPOsteoarthritis multiple joints / LEFT Hip and Low Back Pain Known past history of joint problems with suspected arthritis from prior diagnosis and past imaging, he has history of low back pain and also primary concern is LEFT hip pain. Prior sciatica and piriformis L syndrome. - Last visit evaluated this problem 07/2018 reviewed X-ray results at that time, showed no significant DJD of L hip could be from lumbar DDD - Today he reports overall symptoms are relatively stable still present and not resolved. Some days worse than others. - He describes has some low back pain and stiff, with soreness into L hip at times, now describes some episodes of radiating pain into Left gluteal region into lower leg on back of leg, L >R but someon both side - He admits some "popping" of left hip joint as well and in low back - Worse pain in L hip if riding on mower for longer period of time - he thinks he is off the glucosamine medicine - He has benefited from chiropractor Dr Olen Cordial locally in past with good results, asking if he should return now that [place] pandemic restrictions lifted - Tried occasional aleve for it some relief. Not taking Tylenol. In past has tried Flexeril PRN, Naproxen - Denies any injury, fall, trauma, numbness tingling weakness  Follow-up Hyperlipidemia Last lab checked 02/2019 showed mild elevated LDL still improved from previous 140 down to 120 He has declined statin for ASCVD risk. DC'd aspirin due to easy bleeding. - He is not interested in new cholesterol medicine at this time.  History vitamin D deficiency Improved now on last lab Vit D normalized. He took med for while then only daily supplement in [facility] then restarted lately Now improved more stamina and endurance now more energy  BPH  LUTS / Elevated PSA Followed by [facility], Dr Bernardo Heater last visit 12/2018, he was switched to Rapaflo doing better now. He has had MRI and negative biopsy. PSA improved.  Additional complaints updates:  Ear Itching Eczema - Reports dry flaking rash in ear canals and itching, asking for ear drops.  He has apt at [facility] for [facility] Aids December 2020   Health Maintenance: Due for Flu Vaccine will receive today  Depression screen [facility] 2/9 03/16/2019 07/15/2018 06/17/2018  Decreased Interest 0 0 0  Down, Depressed, Hopeless 0 0 0  PHQ - 2 Score 0 0 0    Past Medical History:  Diagnosis Date  . Elevated PSA   . GERD (gastroesophageal reflux disease)   . Hearing loss    Past Surgical History:  Procedure Laterality Date  . TONSILLECTOMY     age 34   Social History   Socioeconomic History  . Marital status: Married    Spouse name: Not on file  . Number of children: Not on file  . Years of education: Not on file  . Highest education level: Not on file  Occupational History  . Not on file  Social Needs  . Financial resource strain: Not on file  . Food insecurity    Worry: Not on file    Inability: Not on file  . Transportation needs    Medical: Not on file    Non-medical: Not on file  Tobacco Use  . Smoking status: Never  Smoker  . Smokeless tobacco: Current User    Types: Chew  Substance and Sexual Activity  . Alcohol use: Yes    Alcohol/week: 0.0 standard drinks    Comment: "beer on weekends"   . Drug use: No  . Sexual activity: Yes  Lifestyle  . Physical activity    Days per week: Not on file    Minutes per session: Not on file  . Stress: Not on file  Relationships  . Social Herbalist on phone: Not on file    Gets together: Not on file    Attends religious service: Not on file    Active member of club or organization: Not on file    Attends meetings of clubs or organizations: Not on file    Relationship status: Not on file  . Intimate partner violence    Fear  of current or ex partner: Not on file    Emotionally abused: Not on file    Physically abused: Not on file    Forced sexual activity: Not on file  Other Topics Concern  . Not on file  Social History Narrative  . Not on file   Family History  Problem Relation Age of Onset  . Dementia Mother   . Cancer Father        throat/stomach  . Dementia Maternal Aunt   . Cancer Paternal Grandmother   . Cancer Paternal Grandfather   . Dementia Maternal Uncle    Current Outpatient Medications on File Prior to Visit  Medication Sig  . Cholecalciferol (VITAMIN D3) 1000 units CAPS Take 5,000 Units by mouth.   . Multiple Vitamin (MULTI-VITAMINS) TABS Take by mouth.  . silodosin (RAPAFLO) 8 MG CAPS capsule Take 1 capsule (8 mg total) by mouth daily with breakfast.  . GLUCOSAMINE HCL PO Take by mouth.  Marland Kitchen omeprazole (PRILOSEC) 40 MG capsule Take 1 capsule (40 mg total) by mouth daily. (Patient not taking: Reported on 03/16/2019)   No current facility-administered medications on file prior to visit.     Review of Systems  Constitutional: Negative for activity change, appetite change, chills, diaphoresis, fatigue and fever.  HENT: Positive for hearing loss. Negative for congestion.   Eyes: Negative for visual disturbance.  Respiratory: Negative for apnea, cough, chest tightness, shortness of breath and wheezing.   Cardiovascular: Negative for chest pain, palpitations and leg swelling.  Gastrointestinal: Negative for abdominal pain, anal bleeding, blood in stool, constipation, diarrhea, nausea and vomiting.  Endocrine: Negative for cold intolerance.  Genitourinary: Negative for difficulty urinating, dysuria, frequency and hematuria.  Musculoskeletal: Positive for arthralgias (Left hip). Negative for back pain and neck pain.  Skin: Negative for rash.       Dry skin ears  Allergic/Immunologic: Negative for environmental allergies.  Neurological: Negative for dizziness, weakness, light-headedness,  numbness and headaches.  Hematological: Negative for adenopathy.  Psychiatric/Behavioral: Negative for behavioral problems, dysphoric mood and sleep disturbance. The patient is not nervous/anxious.    Per HPI unless specifically indicated above     Objective:    BP 95/66   Pulse 60   Ht 6' (1.829 m)   Wt 193 lb 9.6 oz (87.8 kg)   BMI 26.26 kg/m   Wt Readings from Last 3 Encounters:  03/16/19 193 lb 9.6 oz (87.8 kg)  01/08/19 189 lb 3.2 oz (85.8 kg)  07/15/18 200 lb (90.7 kg)    Physical Exam Vitals signs and nursing note reviewed.  Constitutional:      General: He  is not in acute distress.    Appearance: He is well-developed. He is not diaphoretic.     Comments: Well-appearing, comfortable, cooperative  HENT:     Head: Normocephalic and atraumatic.     Comments: Bilateral external ear canals L>R with some dry flaking irritated skin, some cerumen R>L    Right Ear: Tympanic membrane normal.     Left Ear: Tympanic membrane normal.  Eyes:     General:        Right eye: No discharge.        Left eye: No discharge.     Conjunctiva/sclera: Conjunctivae normal.     Pupils: Pupils are equal, round, and reactive to light.  Neck:     Musculoskeletal: Normal range of motion and neck supple.     Thyroid: No thyromegaly.  Cardiovascular:     Rate and Rhythm: Normal rate and regular rhythm.     Heart sounds: Normal heart sounds. No murmur.  Pulmonary:     Effort: Pulmonary effort is normal. No respiratory distress.     Breath sounds: Normal breath sounds. No wheezing or rales.  Abdominal:     General: Bowel sounds are normal. There is no distension.     Palpations: Abdomen is soft. There is no mass.     Tenderness: There is no abdominal tenderness.  Musculoskeletal: Normal range of motion.        General: No tenderness.     Comments: Upper / Lower Extremities: - Normal muscle tone, strength bilateral upper extremities 5/5, lower extremities 5/5  Lymphadenopathy:     Cervical:  No cervical adenopathy.  Skin:    General: Skin is warm and dry.     Findings: No erythema or rash.  Neurological:     Mental Status: He is alert and oriented to person, place, and time.     Comments: Distal sensation intact to light touch all extremities  Psychiatric:        Behavior: Behavior normal.     Comments: Well groomed, good eye contact, normal speech and thoughts    Results for orders placed or performed in visit on 03/10/19  VITAMIN D 25 Hydroxy (Vit-D Deficiency, Fractures)  Result Value Ref Range   Vit D, 25-Hydroxy 36 30 - 100 ng/mL  Lipid panel  Result Value Ref Range   Cholesterol 203 (H) <200 mg/dL   HDL 69 > OR = 40 mg/dL   Triglycerides 46 <150 mg/dL   LDL Cholesterol (Calc) 120 (H) mg/dL (calc)   Total CHOL/HDL Ratio 2.9 <5.0 (calc)   Non-HDL Cholesterol (Calc) 134 (H) <130 mg/dL (calc)  COMPLETE METABOLIC PANEL WITH GFR  Result Value Ref Range   Glucose, Bld 75 65 - 99 mg/dL   BUN 16 7 - 25 mg/dL   Creat 0.95 0.70 - 1.25 mg/dL   GFR, Est Non African American 81 > OR = 60 mL/min/1.81m2   GFR, Est African American 94 > OR = 60 mL/min/1.46m2   BUN/Creatinine Ratio NOT APPLICABLE 6 - 22 (calc)   Sodium 140 135 - 146 mmol/L   Potassium 4.6 3.5 - 5.3 mmol/L   Chloride 107 98 - 110 mmol/L   CO2 29 20 - 32 mmol/L   Calcium 9.0 8.6 - 10.3 mg/dL   Total Protein 6.2 6.1 - 8.1 g/dL   Albumin 3.9 3.6 - 5.1 g/dL   Globulin 2.3 1.9 - 3.7 g/dL (calc)   AG Ratio 1.7 1.0 - 2.5 (calc)   Total Bilirubin 0.4 0.2 -  1.2 mg/dL   Alkaline phosphatase (APISO) 42 35 - 144 U/L   AST 19 10 - 35 U/L   ALT 24 9 - 46 U/L  CBC with Differential/Platelet  Result Value Ref Range   WBC 4.7 3.8 - 10.8 Thousand/uL   RBC 4.51 4.20 - 5.80 Million/uL   Hemoglobin 13.9 13.2 - 17.1 g/dL   HCT 43.0 38.5 - 50.0 %   MCV 95.3 80.0 - 100.0 fL   MCH 30.8 27.0 - 33.0 pg   MCHC 32.3 32.0 - 36.0 g/dL   RDW 11.7 11.0 - 15.0 %   Platelets 247 140 - 400 Thousand/uL   MPV 10.4 7.5 - 12.5 fL    Neutro Abs 1,744 1,500 - 7,800 cells/uL   Lymphs Abs 2,026 850 - 3,900 cells/uL   Absolute Monocytes 385 200 - 950 cells/uL   Eosinophils Absolute 475 15 - 500 cells/uL   Basophils Absolute 71 0 - 200 cells/uL   Neutrophils Relative % 37.1 %   Total Lymphocyte 43.1 %   Monocytes Relative 8.2 %   Eosinophils Relative 10.1 %   Basophils Relative 1.5 %  Hemoglobin A1c  Result Value Ref Range   Hgb A1c MFr Bld 4.9 <5.7 % of total Hgb   Mean Plasma Glucose 94 (calc)   eAG (mmol/L) 5.2 (calc)      Assessment & Plan:   Problem List Items Addressed This Visit    Benign prostatic hyperplasia with urinary frequency    Controlled on Rapaflo, now with resolved nocturia Followed by [facility] Dr Bernardo Heater, following PSA      Hyperlipidemia    Mildly elevated LDL 120s now miproved Last lipid panel 02/2019 Calculated ASCVD 10 yr risk score mild elevated 9.1% Failed Aspirin 81mg  due to easy bleeding  Plan: 1. Discussion on ASCVD risk again and prevention - offered statin, he declines today, will remain off aspirin for now - he may reconsider low dose statin at future visit 2. Encourage improved lifestyle - low carb/cholesterol, reduce portion size, continue improving regular exercise Follow-up yearly lipids      Overweight (BMI 25.0-29.9)    Encourage improvement lifestyle for wt loss      Primary osteoarthritis involving multiple joints - Primary    Chronic gradual worsening problem with L Hip pain, likely trochanteric bursitis given history and exam with point tenderness. Suspect underlying mild osteoarthritis as possible cause also with Lumbar DDD likely cause of pain into hip, overuse issue - without known injury or trauma. H/o OA in other joints. - No radiation of pain or radicular symptoms - Inadequate conservative therapy   Plan: 1. Discussed conservative OTC medications for arthritis, he is not ready for any stronger rx medications other than OTC 2. May use Tylenol PRN for breakthrough  3. Encouraged use of heating pad 1-2x daily for now then PRN. 4. TRY OTC supplement again Glucosamine-Chondroitin for cartilage hip joint support 5. Future notify us if want to try stronger medication and referral to Ortho if not improved, consider troch bursa injection if needed       Other Visit Diagnoses    Need for immunization against influenza       Relevant Orders   Flu Vaccine QUAD High Dose(Fluad) (Completed)   Acute eczematoid otitis externa of both ears       Relevant Medications   acetic acid-hydrocortisone (VOSOL-HC) OTIC solution    Clinically consistent with otitis externa with eczema/atopic dermatitis with thickening dry flaking skin extending into ear canal.  -  Uncomplicated without sign of secondary infection. No AOM. - Has Presbycusis - to go to [place] for hearing loss hearing aids  Plan Trial on Acetic acid-hydrocortisone otc drops TID x 7-10 days Follow-up within 1 week as needed - may request referral to ENT if not improving   -----  Updated Health Maintenance information - Flu vaccine Reviewed recent lab results with patient Encouraged improvement to lifestyle with diet and exercise - Goal of weight loss   Meds ordered this encounter  Medications  . acetic acid-hydrocortisone (VOSOL-HC) OTIC solution    Sig: Place 3 drops into both ears 3 (three) times daily. May use wick for first 24 hours    Dispense:  10 mL    Refill:  0      Follow up plan: Return in about 1 year (around 03/15/2020) for Yearly Medicare Checkup.  Nobie Putnam, DO [facility] 03/16/2019, 9:28 AM

## 2019-03-16 NOTE — Patient Instructions (Addendum)
Thank you for coming to the office today.  Try Glucosamine + Chondroitin supplement for Arthritis and Joints - it can help your joints move smoother and reduce pain and stiffness.  Let me know if need to consider stronger medication for pain or the joints. Also we can consider future injection and referral to Orthopedic  Lab results are good overall. Normal blood sugar, no anemia or iron deficiency, normal kidneys.  Ear wax - use Debrox OTC kit - ear drops and then the syringe to flush out the wax from ear.   You most likely have Otitis Externa Eczema - this means there is irritated dried flaking skin like Eczema that is affecting the outer ear and the inner ear canal. It can be itchy, irritating, sometimes painful. This is not an infection. But rarely it could progress to a skin infection.  Start with topical ear drops as prescribed use up to 3 drops 3 times a day - can start with using a kleenex rolled wick or other wick OTC - may apply drops to wick to spread out medicine, do this every 6-8 hours or 3 times a day  After first 24-48 hours may use drops directly into ear only. Use for up to 1-2 weeks until resolved.  If not improved by 1 week or any worsening or hearing loss, ear pain, headache, fever/chills, then call office to review your symptoms and we may refer you to ENT  Call 1 week approx before your physical next year if you want [place] to add EKG and tell us what symptom you are having.   Please schedule a Follow-up Appointment to: Return in about 1 year (around 03/15/2020) for [employer].  If you have any other questions or concerns, please feel free to call the office or send a message through [place]. You may also schedule an earlier appointment if necessary.  Additionally, you may be receiving a survey about your experience at our office within a few days to 1 week by e-mail or mail. We value your feedback.  Nobie Putnam, DO [place]

## 2019-03-16 NOTE — Assessment & Plan Note (Signed)
Controlled on Rapaflo, now with resolved nocturia Followed by [facility] Dr Stoioff, following PSA 

## 2019-03-16 NOTE — Assessment & Plan Note (Signed)
Chronic gradual worsening problem with L Hip pain, likely trochanteric bursitis given history and exam with point tenderness. Suspect underlying mild osteoarthritis as possible cause also with Lumbar DDD likely cause of pain into hip, overuse issue - without known injury or trauma. H/o OA in other joints. - No radiation of pain or radicular symptoms - Inadequate conservative therapy   Plan: 1. Discussed conservative OTC medications for arthritis, he is not ready for any stronger rx medications other than OTC 2. May use Tylenol PRN for breakthrough 3. Encouraged use of heating pad 1-2x daily for now then PRN. 4. TRY OTC supplement again Glucosamine-Chondroitin for cartilage hip joint support 5. Future notify us if want to try stronger medication and referral to Ortho if not improved, consider troch bursa injection if needed

## 2019-03-18 ENCOUNTER — Telehealth: Payer: Self-pay | Admitting: Family Medicine

## 2019-03-18 NOTE — Telephone Encounter (Signed)
Patient has question about ear drop clarified his question about ear irrigation.

## 2019-03-18 NOTE — Telephone Encounter (Signed)
Pt wife is requesting a phone call back said they are unsure on the instructions on the mediation that was given yesterday

## 2019-04-27 ENCOUNTER — Ambulatory Visit (INDEPENDENT_AMBULATORY_CARE_PROVIDER_SITE_OTHER): Payer: Medicare Other | Admitting: Family Medicine

## 2019-04-27 ENCOUNTER — Encounter: Payer: Self-pay | Admitting: Family Medicine

## 2019-04-27 ENCOUNTER — Other Ambulatory Visit: Payer: Self-pay

## 2019-04-27 VITALS — BP 127/81 | HR 65 | Temp 98.0°F | Resp 18 | Ht 72.0 in | Wt 194.6 lb

## 2019-04-27 DIAGNOSIS — M8949 Other hypertrophic osteoarthropathy, multiple sites: Secondary | ICD-10-CM

## 2019-04-27 DIAGNOSIS — M25561 Pain in right knee: Secondary | ICD-10-CM | POA: Diagnosis not present

## 2019-04-27 DIAGNOSIS — M159 Polyosteoarthritis, unspecified: Secondary | ICD-10-CM

## 2019-04-27 DIAGNOSIS — G8929 Other chronic pain: Secondary | ICD-10-CM | POA: Diagnosis not present

## 2019-04-27 NOTE — Patient Instructions (Addendum)
Thank you for coming to the office today.  You most likely have knee pain from arthritis I dont think you have torn meniscus or cartilage at this time  - I recommend a Knee Brace to support your knee and limit motion of the knee to help it heal, avoid excess weight bearing and repetitive activities on it  - It is safe to take Tylenol Ext Str 500mg  tabs - take 1 to 2 (max dose 1000mg ) every 6 hours as needed for breakthrough pain, max 24 hour daily dose is 6 to 8 tablets or 4000mg   Use RICE therapy: - R - Rest / relative rest with activity modification avoid overuse and frequent bending or pressure on bent knee - I - Ice packs (make sure you use a towel or sock / something to protect skin) - C - Compression with ACE wrap or immobilizer apply pressure and reduce swelling allowing more support - E - Elevation - if significant swelling, lift leg above heart level (toes above your nose) to help reduce swelling, most helpful at night after day of being on your feet  Will look into [place] for Knee Specialist - stay tuned for referral we will work on this.   Please schedule a Follow-up Appointment to: Return if symptoms worsen or fail to improve, for knee pain.  If you have any other questions or concerns, please feel free to call the office or send a message through [place]. You may also schedule an earlier appointment if necessary.  Additionally, you may be receiving a survey about your experience at our office within a few days to 1 week by e-mail or mail. We value your feedback.  Nobie Putnam, DO [place]

## 2019-04-27 NOTE — Progress Notes (Signed)
Subjective:    Patient ID: Rodney Cummings, male    DOB: May 19, 1951, 68 y.o.   MRN: [id number]  Rodney Cummings is a 68 y.o. male presenting on 04/27/2019 for Knee Pain (Right knee)   HPI   RIGHT Knee Osteoarthritis / Multiple joints - Last visit with me 03/2019, for focus of osteoarthritis and back pain, treated with conservative approach, see prior notes for background information. - Interval update with he has had chronic R knee problems and has had episodic flare ups, recently did some work kneeling and caused flare up impacting his function and causing pain - Today patient reports he is ready to consult with an Orthopedic specialist regarding his knee, he has episodic pain without swelling, mostly pain with walking. He has fairly good range of motion, worse on inner aspect with flexing knee, and without significant instability - Gradual improvement after rest for a week - Tried occasional aleve for it some relief. Not taking Tylenol.In past has tried Flexeril PRN, Naproxen - He is now interested to consult with knee specialist, he would like [place] to locate Knee specialist in [facility] for referral - Denies knee swelling, redness - Denies any injury, fall, trauma, numbness tingling weakness    Depression screen [facility] 2/9 04/27/2019 03/16/2019 07/15/2018  Decreased Interest 0 0 0  Down, Depressed, Hopeless 0 0 0  PHQ - 2 Score 0 0 0  Altered sleeping 0 - -  Tired, decreased energy 0 - -  Change in appetite 0 - -  Feeling bad or failure about yourself  0 - -  Trouble concentrating 0 - -  Moving slowly or fidgety/restless 0 - -  Suicidal thoughts 0 - -  PHQ-9 Score 0 - -    Social History   Tobacco Use  . Smoking status: Never Smoker  . Smokeless tobacco: Current User    Types: Chew  Substance Use Topics  . Alcohol use: Yes    Alcohol/week: 0.0 standard drinks    Comment: "beer on weekends"   . Drug use: No    Review of Systems Per HPI unless specifically indicated above     Objective:    BP 127/81 (BP Location: Left Arm, Patient Position: Sitting, Cuff Size: Normal)   Pulse 65   Temp 98 F (36.7 C) (Oral)   Resp 18   Ht 6' (1.829 m)   Wt 194 lb 9.6 oz (88.3 kg)   SpO2 100%   BMI 26.39 kg/m   Wt Readings from Last 3 Encounters:  04/27/19 194 lb 9.6 oz (88.3 kg)  03/16/19 193 lb 9.6 oz (87.8 kg)  01/08/19 189 lb 3.2 oz (85.8 kg)    Physical Exam Vitals signs and nursing note reviewed.  Constitutional:      General: He is not in acute distress.    Appearance: He is well-developed. He is not diaphoretic.     Comments: Well-appearing, comfortable, cooperative  HENT:     Head: Normocephalic and atraumatic.  Eyes:     General:        Right eye: No discharge.        Left eye: No discharge.     Conjunctiva/sclera: Conjunctivae normal.  Cardiovascular:     Rate and Rhythm: Normal rate.  Pulmonary:     Effort: Pulmonary effort is normal.  Musculoskeletal:     Comments: Right  Knee Inspection: Normal appearance and symmetrical. No ecchymosis or effusion. Palpation: Mild localized tender Left knee medial joint line. Mild crepitus initially  but not persistent ROM: Full active ROM bilaterally Special Testing: Lachman / Valgus/Varus tests negative with intact ligaments (ACL, MCL, LCL). Standing Thessaly meniscus testing negative for instability has some mild pain medially Strength: 5/5 intact knee flex/ext, ankle dorsi/plantarflex Neurovascular: distally intact sensation light touch and pulses   Skin:    General: Skin is warm and dry.     Findings: No erythema or rash.  Neurological:     Mental Status: He is alert and oriented to person, place, and time.  Psychiatric:        Behavior: Behavior normal.     Comments: Well groomed, good eye contact, normal speech and thoughts    Results for orders placed or performed in visit on 03/10/19  VITAMIN D 25 Hydroxy (Vit-D Deficiency, Fractures)  Result Value Ref Range   Vit D, 25-Hydroxy 36 30 - 100  ng/mL  Lipid panel  Result Value Ref Range   Cholesterol 203 (H) <200 mg/dL   HDL 69 > OR = 40 mg/dL   Triglycerides 46 <150 mg/dL   LDL Cholesterol (Calc) 120 (H) mg/dL (calc)   Total CHOL/HDL Ratio 2.9 <5.0 (calc)   Non-HDL Cholesterol (Calc) 134 (H) <130 mg/dL (calc)  COMPLETE METABOLIC PANEL WITH GFR  Result Value Ref Range   Glucose, Bld 75 65 - 99 mg/dL   BUN 16 7 - 25 mg/dL   Creat 0.95 0.70 - 1.25 mg/dL   GFR, Est Non African American 81 > OR = 60 mL/min/1.61m2   GFR, Est African American 94 > OR = 60 mL/min/1.13m2   BUN/Creatinine Ratio NOT APPLICABLE 6 - 22 (calc)   Sodium 140 135 - 146 mmol/L   Potassium 4.6 3.5 - 5.3 mmol/L   Chloride 107 98 - 110 mmol/L   CO2 29 20 - 32 mmol/L   Calcium 9.0 8.6 - 10.3 mg/dL   Total Protein 6.2 6.1 - 8.1 g/dL   Albumin 3.9 3.6 - 5.1 g/dL   Globulin 2.3 1.9 - 3.7 g/dL (calc)   AG Ratio 1.7 1.0 - 2.5 (calc)   Total Bilirubin 0.4 0.2 - 1.2 mg/dL   Alkaline phosphatase (APISO) 42 35 - 144 U/L   AST 19 10 - 35 U/L   ALT 24 9 - 46 U/L  CBC with Differential/Platelet  Result Value Ref Range   WBC 4.7 3.8 - 10.8 Thousand/uL   RBC 4.51 4.20 - 5.80 Million/uL   Hemoglobin 13.9 13.2 - 17.1 g/dL   HCT 43.0 38.5 - 50.0 %   MCV 95.3 80.0 - 100.0 fL   MCH 30.8 27.0 - 33.0 pg   MCHC 32.3 32.0 - 36.0 g/dL   RDW 11.7 11.0 - 15.0 %   Platelets 247 140 - 400 Thousand/uL   MPV 10.4 7.5 - 12.5 fL   Neutro Abs 1,744 1,500 - 7,800 cells/uL   Lymphs Abs 2,026 850 - 3,900 cells/uL   Absolute Monocytes 385 200 - 950 cells/uL   Eosinophils Absolute 475 15 - 500 cells/uL   Basophils Absolute 71 0 - 200 cells/uL   Neutrophils Relative % 37.1 %   Total Lymphocyte 43.1 %   Monocytes Relative 8.2 %   Eosinophils Relative 10.1 %   Basophils Relative 1.5 %  Hemoglobin A1c  Result Value Ref Range   Hgb A1c MFr Bld 4.9 <5.7 % of total Hgb   Mean Plasma Glucose 94 (calc)   eAG (mmol/L) 5.2 (calc)      Assessment & Plan:   Problem List Items  Addressed This Visit    Primary osteoarthritis involving multiple joints   Relevant Orders   Ambulatory referral to Orthopedic Surgery    Other Visit Diagnoses    Chronic pain of right knee    -  Primary   Relevant Orders   Ambulatory referral to Orthopedic Surgery     Subacute on chronic R generalized Knee pain without known injury or trauma following.  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 Seems recent flare up, but history less suggestive of meniscus tear or other acute soft tissue injury - Able to bear weight, has some mechanical symptoms at times without instability - No prior history of knee surgery, arthroscopy - Inadequate conservative therapy   Plan:  Referral to [place] request Dr Foster Simpson or other Knee Specialist for evaluation of R knee chronic osteoarthritis with episodic flare up, he had been doing mostly conservative therapy previously, has arthritis in multiple joints in body, now knee is impacting his function more, requesting orthopedic evaluation and future consideration of surgery or other procedural options  Offered [facility] but patient declined and will defer to Orthopedic specialist, can get x-ray upon ortho evaluation  Offer to continue conservative regimen with acetaminophen, NSAID PRN, glucosamine, RICE therapy , compression support  Future reconsider steroid injection, and rx medication if needed  Orders Placed This Encounter  Procedures  . Ambulatory referral to Orthopedic Surgery    Referral Priority:   Routine    Referral Type:   Surgical    Referral Reason:   Specialty Services Required    Referred to Provider:   Mardi Mainland, MD    Requested Specialty:   Orthopedic Surgery    Number of Visits Requested:   1     No orders of the defined types were placed in this encounter.   Follow up plan: Return if symptoms worsen or fail to improve, for knee pain.   Nobie Putnam, [facility] 04/27/2019, 9:47 AM

## 2019-05-21 DIAGNOSIS — H16142 Punctate keratitis, left eye: Secondary | ICD-10-CM | POA: Diagnosis not present

## 2019-06-07 DIAGNOSIS — M1711 Unilateral primary osteoarthritis, right knee: Secondary | ICD-10-CM | POA: Insufficient documentation

## 2019-07-05 ENCOUNTER — Other Ambulatory Visit: Payer: Self-pay | Admitting: *Deleted

## 2019-07-05 DIAGNOSIS — R972 Elevated prostate specific antigen [PSA]: Secondary | ICD-10-CM

## 2019-07-06 ENCOUNTER — Other Ambulatory Visit: Payer: Self-pay

## 2019-07-06 ENCOUNTER — Other Ambulatory Visit: Payer: Medicare Other

## 2019-07-06 DIAGNOSIS — R972 Elevated prostate specific antigen [PSA]: Secondary | ICD-10-CM

## 2019-07-07 LAB — PSA: Prostate Specific Ag, Serum: 3.3 ng/mL (ref 0.0–4.0)

## 2019-07-07 IMAGING — DX DG LUMBAR SPINE COMPLETE 4+V
5 series · 5 of 5 positions shown · non-contrast
Comparison: None.

CLINICAL DATA: Chronic intermittent low back pain, worsened over
the past 6 months.

EXAM:
LUMBAR SPINE - COMPLETE 4+ VIEW

[l-spine ap]
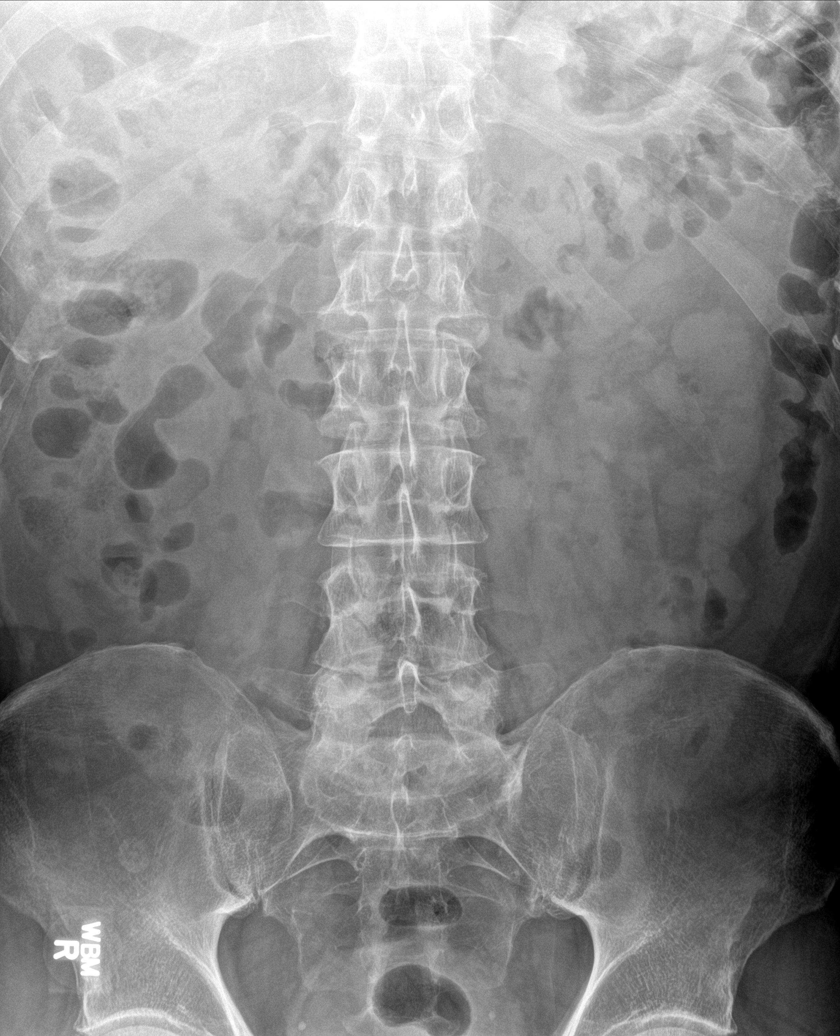

[l-spine obl (1 of 2)]
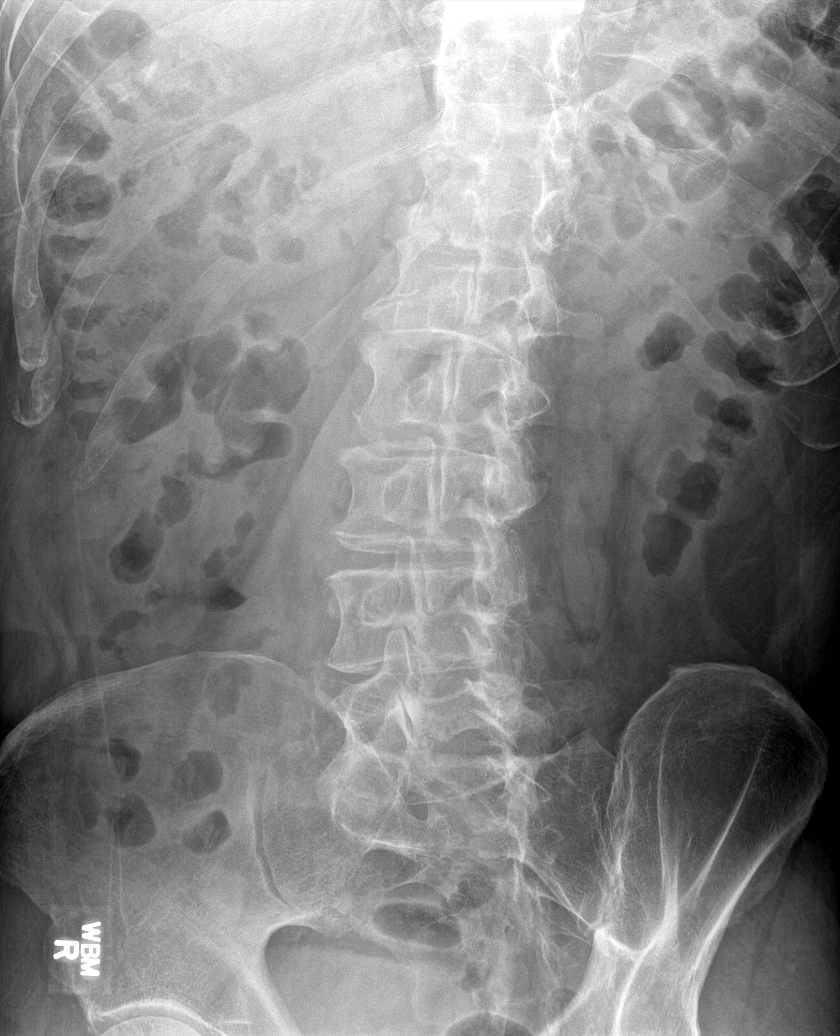

[l-spine obl (2 of 2)]
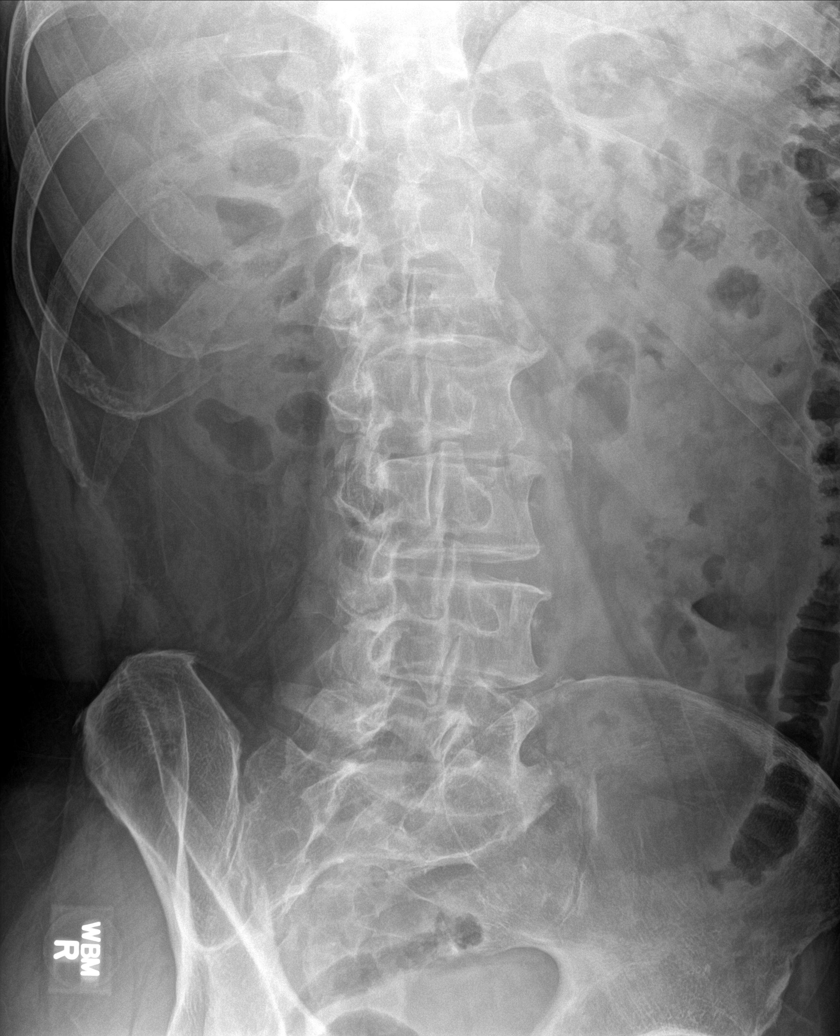

[l-spine lat]
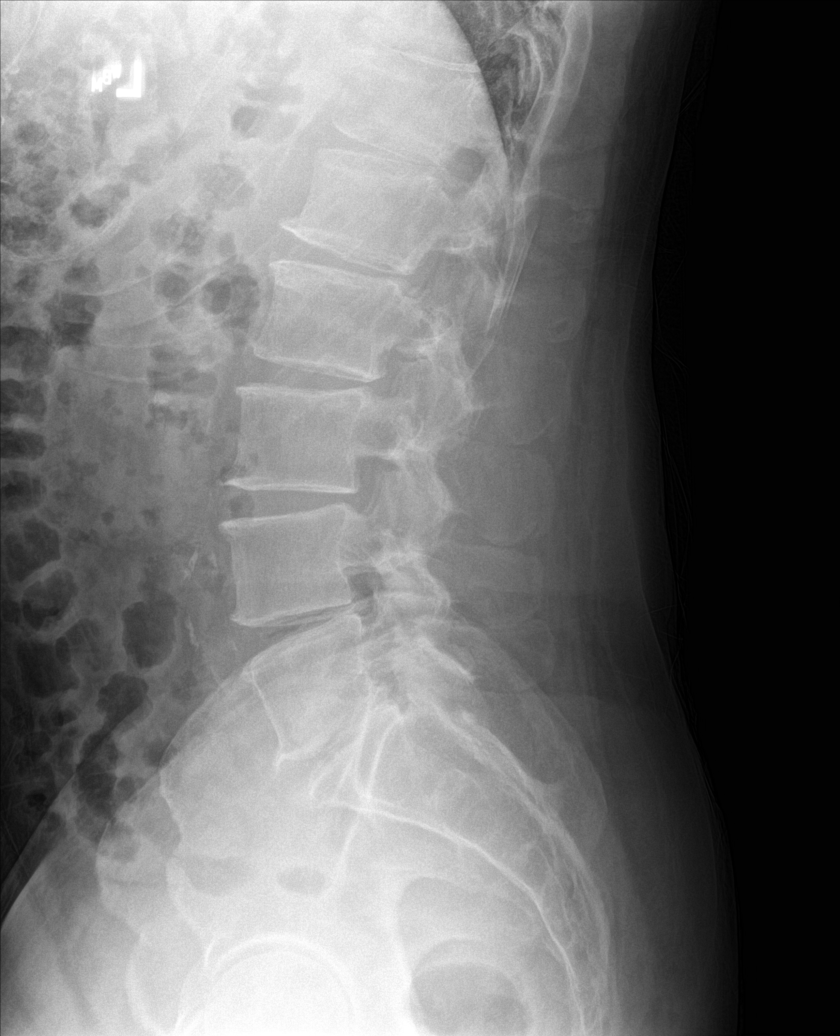

[l-spine spot]
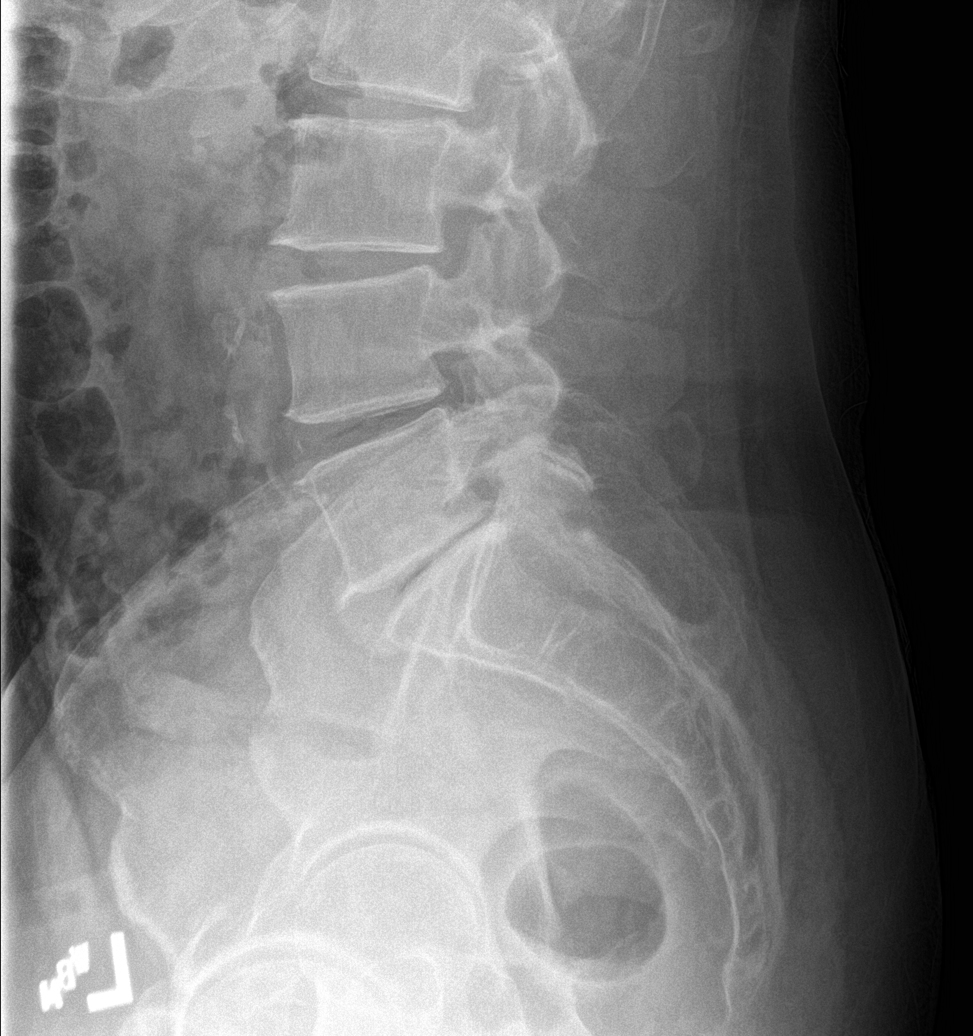

[5 of 5 positions shown; findings below may reference images not displayed]

FINDINGS: Five lumbar type vertebral bodies. No acute fracture or subluxation.
Vertebral body heights are preserved. Trace retrolisthesis at L3-L4.
Mild disc height loss at L4-L5. Moderate disc height loss at L5-S1.
Mild bilateral facet arthropathy at L4-L5 and L5-S1. The sacroiliac
joints are unremarkable. Aortoiliac atherosclerosis.
IMPRESSION: 1. Mild-to-moderate lower lumbar spondylosis.

## 2019-07-12 ENCOUNTER — Telehealth: Payer: Self-pay | Admitting: *Deleted

## 2019-07-12 NOTE — Telephone Encounter (Signed)
-----   Message from Abbie Sons, MD sent at 07/11/2019 12:59 PM EST ----- PSA stable at 3.3.  Follow-up as scheduled

## 2019-07-12 NOTE — Telephone Encounter (Signed)
Notified patient as instructed, patient pleased. Discussed follow-up appointments, patient agrees  

## 2019-07-27 DIAGNOSIS — Z85828 Personal history of other malignant neoplasm of skin: Secondary | ICD-10-CM | POA: Diagnosis not present

## 2019-07-27 DIAGNOSIS — L570 Actinic keratosis: Secondary | ICD-10-CM | POA: Diagnosis not present

## 2019-07-27 DIAGNOSIS — L578 Other skin changes due to chronic exposure to nonionizing radiation: Secondary | ICD-10-CM | POA: Diagnosis not present

## 2019-07-27 DIAGNOSIS — G548 Other nerve root and plexus disorders: Secondary | ICD-10-CM | POA: Diagnosis not present

## 2019-07-27 DIAGNOSIS — Z872 Personal history of diseases of the skin and subcutaneous tissue: Secondary | ICD-10-CM | POA: Diagnosis not present

## 2019-07-27 DIAGNOSIS — L821 Other seborrheic keratosis: Secondary | ICD-10-CM | POA: Diagnosis not present

## 2019-07-27 DIAGNOSIS — Z859 Personal history of malignant neoplasm, unspecified: Secondary | ICD-10-CM | POA: Diagnosis not present

## 2019-11-02 ENCOUNTER — Ambulatory Visit (INDEPENDENT_AMBULATORY_CARE_PROVIDER_SITE_OTHER): Payer: Medicare Other

## 2019-11-02 VITALS — Ht 72.0 in | Wt 191.0 lb

## 2019-11-02 DIAGNOSIS — Z0000 Encounter for general adult medical examination without abnormal findings: Secondary | ICD-10-CM | POA: Diagnosis not present

## 2019-11-02 NOTE — Progress Notes (Signed)
Subjective:   Rodney Cummings is a 69 y.o. male who presents for Medicare Annual/Subsequent preventive examination.  I connected with Rodney Cummings today by telephone and verified that I am speaking with the correct person using two identifiers. Location patient: home Location provider: work Persons participating in the virtual visit: patient, provider.   I discussed the limitations, risks, security and privacy concerns of performing an evaluation and management service by telephone and the availability of in person appointments. I also discussed with the patient that there may be a patient responsible charge related to this service. The patient expressed understanding and verbally consented to this telephonic visit.    Interactive audio and video telecommunications were attempted between this provider and patient, however failed, due to patient having technical difficulties OR patient did not have access to video capability.  We continued and completed visit with audio only.  Some vital signs may be absent or patient reported.   Time Spent with patient on telephone encounter: 15 minutes  Review of Systems:   Cardiac Risk Factors include: advanced age (>74men, >21 women);male gender;dyslipidemia;hypertension     Objective:    Vitals: Ht 6' (1.829 m)   Wt 191 lb (86.6 kg)   BMI 25.90 kg/m   Body mass index is 25.9 kg/m.  Advanced Directives 11/02/2019  Does Patient Have a Medical Advance Directive? Yes  Type of Advance Directive Living will;Healthcare Power of [place] in Chart? No - copy requested    Tobacco Social History   Tobacco Use  Smoking Status Never Smoker  Smokeless Tobacco Current User  . Types: Chew     Ready to quit: Not Answered Counseling given: Not Answered   Clinical Intake:  Pre-visit preparation completed: Yes  Pain : No/denies pain     Nutritional Status: BMI 25 -29 Overweight Nutritional Risks:  None Diabetes: No  How often do you need to have someone help you when you read instructions, pamphlets, or other written materials from your doctor or pharmacy?: 1 - Never  Interpreter Needed?: No  Information entered by :: Shyvonne Chastang,LPN  Past Medical History:  Diagnosis Date  . Elevated PSA   . GERD (gastroesophageal reflux disease)   . Hearing loss    Past Surgical History:  Procedure Laterality Date  . SQUAMOUS CELL CARCINOMA EXCISION Right 2020   arm   . TONSILLECTOMY     age 26   Family History  Problem Relation Age of Onset  . Dementia Mother   . Cancer Father        throat/stomach  . Dementia Maternal Aunt   . Cancer Paternal Grandmother   . Cancer Paternal Grandfather   . Dementia Maternal Uncle    Social History   Socioeconomic History  . Marital status: Married    Spouse name: Not on file  . Number of children: Not on file  . Years of education: Not on file  . Highest education level: Not on file  Occupational History  . Not on file  Tobacco Use  . Smoking status: Never Smoker  . Smokeless tobacco: Current User    Types: Chew  Substance and Sexual Activity  . Alcohol use: Yes    Alcohol/week: 0.0 standard drinks    Comment: "beer on weekends"   . Drug use: No  . Sexual activity: Yes  Other Topics Concern  . Not on file  Social History Narrative  . Not on file   Social Determinants of  Health   Financial Resource Strain: Low Risk   . Difficulty of Paying Living Expenses: Not hard at all  Food Insecurity: No Food Insecurity  . Worried About Charity fundraiser in the Last Year: Never true  . Ran Out of Food in the Last Year: Never true  Transportation Needs: No Transportation Needs  . Lack of Transportation (Medical): No  . Lack of Transportation (Non-Medical): No  Physical Activity: Insufficiently Active  . Days of Exercise per Week: 2 days  . Minutes of Exercise per Session: 20 min  Stress:   . Feeling of Stress :   Social  Connections: Not Isolated  . Frequency of Communication with Friends and Family: More than three times a week  . Frequency of Social Gatherings with Friends and Family: More than three times a week  . Attends Religious Services: More than 4 times per year  . Active Member of Clubs or Organizations: Yes  . Attends Archivist Meetings: More than 4 times per year  . Marital Status: Married    Outpatient Encounter Medications as of 11/02/2019  Medication Sig  . acetic acid-hydrocortisone (VOSOL-HC) OTIC solution Place 3 drops into both ears 3 (three) times daily. May use wick for first 24 hours  . Cholecalciferol (VITAMIN D3) 1000 units CAPS Take 5,000 Units by mouth.   Marland Kitchen GLUCOSAMINE HCL PO Take by mouth.  . Multiple Vitamin (MULTI-VITAMINS) TABS Take by mouth.  Marland Kitchen omeprazole (PRILOSEC) 40 MG capsule Take 1 capsule (40 mg total) by mouth daily.  . silodosin (RAPAFLO) 8 MG CAPS capsule Take 1 capsule (8 mg total) by mouth daily with breakfast.  . vitamin E 180 MG (400 UNITS) capsule Take by mouth.   No facility-administered encounter medications on file as of 11/02/2019.    Activities of Daily Living In your present state of health, do you have any difficulty performing the following activities: 11/02/2019 03/16/2019  Hearing? Y Y  Comment hearing aids -  Vision? N N  Difficulty concentrating or making decisions? N N  Comment - hip pain  Walking or climbing stairs? N N  Dressing or bathing? N N  Doing errands, shopping? N N  Preparing Food and eating ? N N  Using the Toilet? N N  In the past six months, have you accidently leaked urine? N N  Do you have problems with loss of bowel control? N N  Managing your Medications? N N  Managing your Finances? N N  Housekeeping or managing your Housekeeping? N N  Some recent data might be hidden    Patient Care Team: Olin Hauser, DO as PCP - General (Family Medicine)   Assessment:   This is a routine wellness  examination for Rodney Cummings.  Exercise Activities and Dietary recommendations Current Exercise Habits: Home exercise routine, Time (Minutes): 20, Frequency (Times/Week): 3, Weekly Exercise (Minutes/Week): 60, Intensity: Mild, Exercise limited by: None identified  Goals Addressed   None     Fall Risk: Fall Risk  11/02/2019 03/16/2019 03/16/2019 07/15/2018 06/17/2018  Falls in the past year? 0 0 0 0 0  Number falls in past yr: 0 0 0 - -  Injury with Fall? 0 - - - -  Risk for fall due to : - - History of fall(s) - -  Follow up - - - Falls evaluation completed Falls evaluation completed    [place]:  Any stairs in or around the home? Yes  If so, are there  any without handrails? No   Home free of loose throw rugs in walkways, pet beds, electrical cords, etc? Yes  Adequate lighting in your home to reduce risk of falls? Yes   ASSISTIVE DEVICES UTILIZED TO PREVENT FALLS:  Life alert? No  Use of a cane, walker or w/c? No  Grab bars in the bathroom? No  Shower chair or bench in shower? No  Elevated toilet seat or a handicapped toilet? No   TIMED UP AND GO:  Unable to perform   Depression Screen PHQ 2/9 Scores 11/02/2019 04/27/2019 03/16/2019 07/15/2018  PHQ - 2 Score 0 0 0 0  PHQ- 9 Score - 0 - -    Cognitive Function        Immunization History  Administered Date(s) Administered  . Fluad Quad(high Dose 65+) 03/16/2019  . Influenza, High Dose Seasonal PF 04/23/2016, 04/17/2017, 03/18/2018  . PFIZER SARS-COV-2 Vaccination 07/12/2019, 08/01/2019  . Pneumococcal Conjugate-13 10/17/2015  . Tdap 10/17/2015    Qualifies for Shingles Vaccine? Yes  Zostavax completed n/a. Due for Shingrix. Education has been provided regarding the importance of this vaccine. Pt has been advised to call insurance company to determine out of pocket expense. Advised may also receive vaccine at local pharmacy or Health Dept. Verbalized acceptance and understanding.  Tdap: up  to date   Flu Vaccine: up to date   Pneumococcal Vaccine: completed series   Covid-19 Vaccine: Completed vaccines. Pfzier   Screening Tests Health Maintenance  Topic Date Due  . INFLUENZA VACCINE  01/09/2020  . COLONOSCOPY  10/28/2023  . TETANUS/TDAP  10/16/2025  . COVID-19 Vaccine  Completed  . Hepatitis C Screening  Completed  . PNA vac Low Risk Adult  Completed   Cancer Screenings:  Colorectal Screening: Completed 2015. Repeat every 10 years  Lung Cancer Screening: (Low Dose CT Chest recommended if Age 37-80 years, 30 pack-year currently smoking OR have quit w/in 15years.) does not qualify.    Additional Screening:  Hepatitis C Screening: does qualify; Completed 2018  Vision Screening: Recommended annual ophthalmology exams for early detection of glaucoma and other disorders of the eye. Is the patient up to date with their annual eye exam?  Yes  Who is the provider or what is the name of the office in which the pt attends annual eye exams? Dr.Woodard  Dental Screening: Recommended annual dental exams for proper oral hygiene  Community Resource Referral:  CRR required this visit?  No        Plan:  I have personally reviewed and addressed the Medicare Annual Wellness questionnaire and have noted the following in the patient's chart:  A. Medical and social history B. Use of alcohol, tobacco or illicit drugs  C. Current medications and supplements D. Functional ability and status E.  Nutritional status F.  Physical activity G. Advance directives H. List of other physicians I.  Hospitalizations, surgeries, and ER visits in previous 12 months J.  [place] such as hearing and vision if needed, cognitive and depression L. Referrals and appointments   In addition, I have reviewed and discussed with patient certain preventive protocols, quality metrics, and best practice recommendations. A written personalized care plan for preventive services as well as  general preventive health recommendations were provided to patient.   Signed,   Bevelyn Ngo, LPN  [id number] Nurse Health Advisor   Nurse Notes: none

## 2019-11-02 NOTE — Patient Instructions (Signed)
Rodney Cummings , Thank you for taking time to come for your Medicare Wellness Visit. I appreciate your ongoing commitment to your health goals. Please review the following plan we discussed and let me know if I can assist you in the future.   Screening recommendations/referrals: Colonoscopy: completed 2015, due 2025 Recommended yearly ophthalmology/optometry visit for glaucoma screening and checkup Recommended yearly dental visit for hygiene and checkup  Vaccinations: Influenza vaccine: up to date, due 02/2020 Pneumococcal vaccine: completed  Tdap vaccine: up to date  Shingles vaccine: shingrix eligible, check with [facility] for vaccine    Covid-19: completed   Advanced directives: Please bring a copy of your health care power of attorney and living will to the office at your convenience.  Conditions/risks identified: none   Next appointment: Follow up in one year for your annual wellness visit   Preventive Care 65 Years and Older, Male Preventive care refers to lifestyle choices and visits with your health care provider that can promote health and wellness. What does preventive care include?  A yearly physical exam. This is also called an annual well check.  Dental exams once or twice a year.  Routine eye exams. Ask your health care provider how often you should have your eyes checked.  Personal lifestyle choices, including:  Daily care of your teeth and gums.  Regular physical activity.  Eating a healthy diet.  Avoiding tobacco and drug use.  Limiting alcohol use.  Practicing safe sex.  Taking low doses of aspirin every day.  Taking vitamin and mineral supplements as recommended by your health care provider. What happens during an annual well check? The services and screenings done by your health care provider during your annual well check will depend on your age, overall health, lifestyle risk factors, and family history of disease. Counseling  Your health care provider may  ask you questions about your:  Alcohol use.  Tobacco use.  Drug use.  Emotional well-being.  Home and relationship well-being.  Sexual activity.  Eating habits.  History of falls.  Memory and ability to understand (cognition).  Work and work Statistician. Screening  You may have the following tests or measurements:  Height, weight, and BMI.  Blood pressure.  Lipid and cholesterol levels. These may be checked every 5 years, or more frequently if you are over 49 years old.  Skin check.  Lung cancer screening. You may have this screening every year starting at age 34 if you have a 30-pack-year history of smoking and currently smoke or have quit within the past 15 years.  Fecal occult blood test (FOBT) of the stool. You may have this test every year starting at age 54.  Flexible sigmoidoscopy or colonoscopy. You may have a sigmoidoscopy every 5 years or a colonoscopy every 10 years starting at age 83.  Prostate cancer screening. Recommendations will vary depending on your family history and other risks.  Hepatitis C blood test.  Hepatitis B blood test.  Sexually transmitted disease (STD) testing.  Diabetes screening. This is done by checking your blood sugar (glucose) after you have not eaten for a while (fasting). You may have this done every 1-3 years.  Abdominal aortic aneurysm (AAA) screening. You may need this if you are a current or former smoker.  Osteoporosis. You may be screened starting at age 3 if you are at high risk. Talk with your health care provider about your test results, treatment options, and if necessary, the need for more tests. Vaccines  Your health care  provider may recommend certain vaccines, such as:  Influenza vaccine. This is recommended every year.  Tetanus, diphtheria, and acellular pertussis (Tdap, Td) vaccine. You may need a Td booster every 10 years.  Zoster vaccine. You may need this after age 47.  Pneumococcal 13-valent  conjugate (PCV13) vaccine. One dose is recommended after age 31.  Pneumococcal polysaccharide (PPSV23) vaccine. One dose is recommended after age 50. Talk to your health care provider about which screenings and vaccines you need and how often you need them. This information is not intended to replace advice given to you by your health care provider. Make sure you discuss any questions you have with your health care provider. Document Released: 06/23/2015 Document Revised: 02/14/2016 Document Reviewed: 03/28/2015 Elsevier Interactive Patient Education  2017 [place] Prevention in the Home Falls can cause injuries. They can happen to people of all ages. There are many things you can do to make your home safe and to help prevent falls. What can I do on the outside of my home?  Regularly fix the edges of walkways and driveways and fix any cracks.  Remove anything that might make you trip as you walk through a door, such as a raised step or threshold.  Trim any bushes or trees on the path to your home.  Use bright outdoor lighting.  Clear any walking paths of anything that might make someone trip, such as rocks or tools.  Regularly check to see if handrails are loose or broken. Make sure that both sides of any steps have handrails.  Any raised decks and porches should have guardrails on the edges.  Have any leaves, snow, or ice cleared regularly.  Use sand or salt on walking paths during winter.  Clean up any spills in your garage right away. This includes oil or grease spills. What can I do in the bathroom?  Use night lights.  Install grab bars by the toilet and in the tub and shower. Do not use towel bars as grab bars.  Use non-skid mats or decals in the tub or shower.  If you need to sit down in the shower, use a plastic, non-slip stool.  Keep the floor dry. Clean up any water that spills on the floor as soon as it happens.  Remove soap buildup in the tub or  shower regularly.  Attach bath mats securely with double-sided non-slip rug tape.  Do not have throw rugs and other things on the floor that can make you trip. What can I do in the bedroom?  Use night lights.  Make sure that you have a light by your bed that is easy to reach.  Do not use any sheets or blankets that are too big for your bed. They should not hang down onto the floor.  Have a firm chair that has side arms. You can use this for support while you get dressed.  Do not have throw rugs and other things on the floor that can make you trip. What can I do in the kitchen?  Clean up any spills right away.  Avoid walking on wet floors.  Keep items that you use a lot in easy-to-reach places.  If you need to reach something above you, use a strong step stool that has a grab bar.  Keep electrical cords out of the way.  Do not use floor polish or wax that makes floors slippery. If you must use wax, use non-skid floor wax.  Do not have throw  rugs and other things on the floor that can make you trip. What can I do with my stairs?  Do not leave any items on the stairs.  Make sure that there are handrails on both sides of the stairs and use them. Fix handrails that are broken or loose. Make sure that handrails are as long as the stairways.  Check any carpeting to make sure that it is firmly attached to the stairs. Fix any carpet that is loose or worn.  Avoid having throw rugs at the top or bottom of the stairs. If you do have throw rugs, attach them to the floor with carpet tape.  Make sure that you have a light switch at the top of the stairs and the bottom of the stairs. If you do not have them, ask someone to add them for you. What else can I do to help prevent falls?  Wear shoes that:  Do not have high heels.  Have rubber bottoms.  Are comfortable and fit you well.  Are closed at the toe. Do not wear sandals.  If you use a stepladder:  Make sure that it is fully  opened. Do not climb a closed stepladder.  Make sure that both sides of the stepladder are locked into place.  Ask someone to hold it for you, if possible.  Clearly mark and make sure that you can see:  Any grab bars or handrails.  First and last steps.  Where the edge of each step is.  Use tools that help you move around (mobility aids) if they are needed. These include:  Canes.  Walkers.  Scooters.  Crutches.  Turn on the lights when you go into a dark area. Replace any light bulbs as soon as they burn out.  Set up your furniture so you have a clear path. Avoid moving your furniture around.  If any of your floors are uneven, fix them.  If there are any pets around you, be aware of where they are.  Review your medicines with your doctor. Some medicines can make you feel dizzy. This can increase your chance of falling. Ask your doctor what other things that you can do to help prevent falls. This information is not intended to replace advice given to you by your health care provider. Make sure you discuss any questions you have with your health care provider. Document Released: 03/23/2009 Document Revised: 11/02/2015 Document Reviewed: 07/01/2014 Elsevier Interactive Patient Education  2017 [employer].

## 2019-12-29 ENCOUNTER — Other Ambulatory Visit: Payer: Self-pay | Admitting: Family Medicine

## 2019-12-29 DIAGNOSIS — K2100 Gastro-esophageal reflux disease with esophagitis, without bleeding: Secondary | ICD-10-CM

## 2020-01-12 ENCOUNTER — Other Ambulatory Visit: Payer: Self-pay

## 2020-01-12 DIAGNOSIS — R972 Elevated prostate specific antigen [PSA]: Secondary | ICD-10-CM

## 2020-01-14 ENCOUNTER — Other Ambulatory Visit: Payer: Self-pay

## 2020-01-14 ENCOUNTER — Other Ambulatory Visit: Payer: Medicare Other

## 2020-01-14 DIAGNOSIS — R972 Elevated prostate specific antigen [PSA]: Secondary | ICD-10-CM | POA: Diagnosis not present

## 2020-01-15 LAB — PSA: Prostate Specific Ag, Serum: 3.8 ng/mL (ref 0.0–4.0)

## 2020-01-18 ENCOUNTER — Ambulatory Visit: Payer: Medicare Other | Admitting: Urology

## 2020-01-19 ENCOUNTER — Encounter: Payer: Self-pay | Admitting: Urology

## 2020-01-19 ENCOUNTER — Ambulatory Visit (INDEPENDENT_AMBULATORY_CARE_PROVIDER_SITE_OTHER): Payer: Medicare Other | Admitting: Urology

## 2020-01-19 ENCOUNTER — Other Ambulatory Visit: Payer: Self-pay

## 2020-01-19 VITALS — BP 116/74 | HR 80 | Ht 72.0 in | Wt 195.0 lb

## 2020-01-19 DIAGNOSIS — N401 Enlarged prostate with lower urinary tract symptoms: Secondary | ICD-10-CM

## 2020-01-19 DIAGNOSIS — R350 Frequency of micturition: Secondary | ICD-10-CM

## 2020-01-19 DIAGNOSIS — N4340 Spermatocele of epididymis, unspecified: Secondary | ICD-10-CM | POA: Diagnosis not present

## 2020-01-19 DIAGNOSIS — Z87898 Personal history of other specified conditions: Secondary | ICD-10-CM

## 2020-01-19 MED ORDER — SILODOSIN 8 MG PO CAPS: 8.0000 mg | ORAL_CAPSULE | Freq: Every day | ORAL | 3 refills | Status: DC

## 2020-01-19 NOTE — Progress Notes (Signed)
01/19/2020 9:17 AM   Rodney Cummings 1950-06-28 [id number]  Referring provider: Olin Hauser, DO [address] [place],  [place] [address]  Chief Complaint  Patient presents with  . Elevated PSA    Urologic history: 1.Elevated PSA - Benign Prostate Biopsy (~2007) following mid 4 range PSA  - PSA up to 6.2 with MRI revealing PI-RADS 3/PI-RADS 4 lesions with 56-gram prostate volume.  - Fusion biopsy (12/30/2017) at [facility]. Pathology:Standard 12 core biopsy and targeted lesions all showed benign prostate tissue. One core from the targeted biopsy did show focal chronic inflammatory change. - Last PSA (07/07/2018) was 3.7  2.BPH with lower urinary tract symptoms - Currently onsilodosin  3.History of stone disease - s/p endoscopic stone removal in 1986  HPI: 69 y.o. male presents for annual follow-up.   No problems since last years visit  No bothersome LUTS, stable on tamsulosin  Did have questions regarding minimally invasive surgical options  Denies dysuria, gross hematuria  Denies flank, abdominal or pelvic pain  Left spermatocele has increased in size but not bothersome  PSA 01/14/2020 stable at 3.8   PMH: Past Medical History:  Diagnosis Date  . Elevated PSA   . GERD (gastroesophageal reflux disease)   . Hearing loss     Surgical History: Past Surgical History:  Procedure Laterality Date  . SQUAMOUS CELL CARCINOMA EXCISION Right 2020   arm   . TONSILLECTOMY     age 23    Home Medications:  Allergies as of 01/19/2020   No Known Allergies     Medication List       Accurate as of January 19, 2020  9:17 AM. If you have any questions, ask your nurse or doctor.        acetic acid-hydrocortisone OTIC solution Commonly known as: VOSOL-HC Place 3 drops into both ears 3 (three) times daily. May use wick for first 24 hours   GLUCOSAMINE HCL PO Take by mouth.     Multi-Vitamins Tabs Take by mouth.   omeprazole 40 MG capsule Commonly known as: PRILOSEC TAKE 1 CAPSULE DAILY   silodosin 8 MG Caps capsule Commonly known as: RAPAFLO Take 1 capsule (8 mg total) by mouth daily with breakfast.   Vitamin D3 25 MCG (1000 UT) Caps Take 5,000 Units by mouth.   vitamin E 180 MG (400 UNITS) capsule Take by mouth.       Allergies: No Known Allergies  Family History: Family History  Problem Relation Age of Onset  . Dementia Mother   . Cancer Father        throat/stomach  . Dementia Maternal Aunt   . Cancer Paternal Grandmother   . Cancer Paternal Grandfather   . Dementia Maternal Uncle     Social History:  reports that he has never smoked. His smokeless tobacco use includes chew. He reports current alcohol use. He reports that he does not use drugs.   Physical Exam: BP 116/74   Pulse 80   Ht 6' (1.829 m)   Wt 195 lb (88.5 kg)   BMI 26.45 kg/m   Constitutional:  Alert and oriented, No acute distress. HEENT: [place] AT, moist mucus membranes.  Trachea midline, no masses. Cardiovascular: No clubbing, cyanosis, or edema. Respiratory: Normal respiratory effort, no increased work of breathing. GI: Abdomen is soft, nontender, nondistended, no abdominal masses GU: Large left spermatocele, nontender.  Prostate 50 g, smooth without nodules Skin: No rashes, bruises or suspicious lesions. Neurologic: Grossly intact, no focal deficits, moving all  4 extremities. Psychiatric: Normal mood and affect.   Assessment & Plan:    1.  BPH with LUTS  Stable symptoms on silodosin which was refilled  UroLift was discussed in detail including the procedure, follow-up and efficacy.  He was provided literature.  We also discussed [facility] and he was informed there are providers in [place] performing this procedure.  He desires to continue medical management for now  2.  History elevated PSA  PSA 3.8 and benign DRE  Continue annual follow-up  3.  Left  spermatocele  Enlarging but minimal symptoms.  Spermatocele ectomy was discussed   Abbie Sons, MD  [facility] [address], [facility] [place], [place] [address] [phone]

## 2020-02-07 ENCOUNTER — Encounter: Payer: Self-pay | Admitting: Family Medicine

## 2020-02-07 ENCOUNTER — Ambulatory Visit (INDEPENDENT_AMBULATORY_CARE_PROVIDER_SITE_OTHER): Payer: Medicare Other | Admitting: Family Medicine

## 2020-02-07 ENCOUNTER — Other Ambulatory Visit: Payer: Self-pay

## 2020-02-07 VITALS — BP 108/65 | HR 77 | Temp 97.5°F | Resp 16 | Ht 72.0 in | Wt 199.8 lb

## 2020-02-07 DIAGNOSIS — S76301A Unspecified injury of muscle, fascia and tendon of the posterior muscle group at thigh level, right thigh, initial encounter: Secondary | ICD-10-CM

## 2020-02-07 DIAGNOSIS — M7989 Other specified soft tissue disorders: Secondary | ICD-10-CM

## 2020-02-07 DIAGNOSIS — M79661 Pain in right lower leg: Secondary | ICD-10-CM

## 2020-02-07 NOTE — Progress Notes (Signed)
Subjective:    Patient ID: Rodney Cummings, male    DOB: Oct 19, 1950, 69 y.o.   MRN: [id number]  Rodney Cummings is a 69 y.o. male presenting on 02/07/2020 for Muscle Pain (Right side hamstring muscle onset yesterday pain level 8)  Patient presents for a same day appointment.  HPI   Right thigh pain posteriorly / Right Hamstring strain / possible tear Reports acute injury yesterday water skiing, he was trying to stand up on skiis and he felt "something pop" he has difficulty with Right hamstring thigh pain, has had persistent severe 8 out of 10 pain suddenly and has remained fairly persistent, he describes fairly constant pain in that area, feels swollen and tender to touch as well. He has difficulty standing from sitting position, but he says if on his feet he can stand and walk with a limp due to pain. He takes Tylenol / Ibuprofen PRN. Using ice packs, temporary relief. - He is worried about muscle injury or tear, he asks for further evaluation - Denies any new injury since the above mentioned accident, no other fall - Denies other joint swelling or other injury  Health Maintenance: UTD COVID19 vaccine, Pfizer 07/2019  Depression screen [facility] 2/9 11/02/2019 04/27/2019 03/16/2019  Decreased Interest 0 0 0  Down, Depressed, Hopeless 0 0 0  PHQ - 2 Score 0 0 0  Altered sleeping - 0 -  Tired, decreased energy - 0 -  Change in appetite - 0 -  Feeling bad or failure about yourself  - 0 -  Trouble concentrating - 0 -  Moving slowly or fidgety/restless - 0 -  Suicidal thoughts - 0 -  PHQ-9 Score - 0 -    Social History   Tobacco Use  . Smoking status: Never Smoker  . Smokeless tobacco: Current User    Types: Chew  Vaping Use  . Vaping Use: Never used  Substance Use Topics  . Alcohol use: Yes    Alcohol/week: 0.0 standard drinks    Comment: "beer on weekends"   . Drug use: No    Review of Systems Per HPI unless specifically indicated above     Objective:    BP 108/65   Pulse  77   Temp (!) 97.5 F (36.4 C) (Temporal)   Resp 16   Ht 6' (1.829 m)   Wt 199 lb 12.8 oz (90.6 kg)   SpO2 99%   BMI 27.10 kg/m   Wt Readings from Last 3 Encounters:  02/07/20 199 lb 12.8 oz (90.6 kg)  01/19/20 195 lb (88.5 kg)  11/02/19 191 lb (86.6 kg)    Physical Exam Vitals and nursing note reviewed.  Constitutional:      General: He is not in acute distress.    Appearance: He is well-developed. He is not diaphoretic.     Comments: Well-appearing, comfortable, cooperative  HENT:     Head: Normocephalic and atraumatic.  Eyes:     General:        Right eye: No discharge.        Left eye: No discharge.     Conjunctiva/sclera: Conjunctivae normal.  Cardiovascular:     Rate and Rhythm: Normal rate.  Pulmonary:     Effort: Pulmonary effort is normal.  Musculoskeletal:        General: Swelling and tenderness (right posterior thigh hamstring muscle body with some fullness and tenderness on palpation, not over lower tendons inserting into knee) present.     Right lower leg:  No edema.     Left lower leg: No edema.     Comments: He can extend and flex R hip He can flex his R knee, but has difficulty extending R knee actively due to R hamstring pain. Antalgic gait with limp due to pain  Skin:    General: Skin is warm and dry.     Findings: No erythema or rash.  Neurological:     Mental Status: He is alert and oriented to person, place, and time.  Psychiatric:        Behavior: Behavior normal.     Comments: Well groomed, good eye contact, normal speech and thoughts    Results for orders placed or performed in visit on 01/14/20  PSA  Result Value Ref Range   Prostate Specific Ag, Serum 3.8 0.0 - 4.0 ng/mL      Assessment & Plan:   Problem List Items Addressed This Visit    None    Visit Diagnoses    Right hamstring injury, initial encounter    -  Primary   Relevant Orders   Ambulatory referral to Sports Medicine   Pain and swelling of right lower leg        Relevant Orders   Ambulatory referral to Sports Medicine      Acute Right Hamstring muscle belly injury with mechanism onset 8/29 while attempting to standing up on water ski Concern with persistent pain and swelling in this area - cannot rule out partial tear  No other deformity identified on exam, R knee and hip appear intact on gross range of motion, some limitation due to his pain today He has difficulty standing from seated position in transition, but if uses upper body able to stand up He is able to ambulate with some antalgic gait on his R leg and he is not requiring assistance to ambulate  Plan - urgent referral to [facility], Dr Raeford Razor for prompt follow-up given mechanism and sudden injury and patients concern / limited mobility  Update - patient was scheduled tomorrow 02/08/20 already.  - Defer imaging today in office - May warrant ultrasound or other advanced imaging - He may continue current OTC analgesia for now seems to be temporarily effective - Ultimately if more of a strain - he may pursue some PT regimen once pain improves - Advised caution for next 24-48 hours, limit ambulation and may use RICE therapy  No orders of the defined types were placed in this encounter.   Orders Placed This Encounter  Procedures  . Ambulatory referral to Sports Medicine    Referral Priority:   Urgent    Referral Type:   Consultation    Number of Visits Requested:   1     Follow up plan: Return if symptoms worsen or fail to improve.   Nobie Putnam, [facility] 02/07/2020, 2:49 PM

## 2020-02-07 NOTE — Patient Instructions (Addendum)
Thank you for coming to the office today.  Urgent referral to Sports medicine doctor  Clearance Coots, MD Address: [address] [place], [place] [address] Phone: [phone]  Hopefully we can get you scheduled for 1-2 days. Will submit referral now.  Recommend to start taking Tylenol Extra Strength 500mg  tabs - take 1 to 2 tabs per dose (max 1000mg ) every 6-8 hours for pain (take regularly, don't skip a dose for next 7 days), max 24 hour daily dose is 6 tablets or 3000mg . In the future you can repeat the same everyday Tylenol course for 1-2 weeks at a time.   Use RICE therapy: - R - Rest / relative rest with activity modification avoid overuse of joint - I - Ice packs (make sure you use a towel or sock / something to protect skin) - C - Compression with ACE wrap to apply pressure and reduce swelling allowing more support - E - Elevation - if significant swelling, lift leg above heart level (toes above your nose) to help reduce swelling, most helpful at night after day of being on your feet    Please schedule a Follow-up Appointment to: Return if symptoms worsen or fail to improve.  If you have any other questions or concerns, please feel free to call the office or send a message through [place]. You may also schedule an earlier appointment if necessary.  Additionally, you may be receiving a survey about your experience at our office within a few days to 1 week by e-mail or mail. We value your feedback.  Nobie Putnam, DO [facility]

## 2020-02-08 ENCOUNTER — Ambulatory Visit (INDEPENDENT_AMBULATORY_CARE_PROVIDER_SITE_OTHER): Payer: Medicare Other | Admitting: Family Medicine

## 2020-02-08 ENCOUNTER — Ambulatory Visit: Payer: Self-pay

## 2020-02-08 ENCOUNTER — Encounter: Payer: Self-pay | Admitting: Family Medicine

## 2020-02-08 VITALS — BP 118/75 | HR 67 | Ht 72.0 in | Wt 195.0 lb

## 2020-02-08 DIAGNOSIS — S76311A Strain of muscle, fascia and tendon of the posterior muscle group at thigh level, right thigh, initial encounter: Secondary | ICD-10-CM

## 2020-02-08 DIAGNOSIS — S76311D Strain of muscle, fascia and tendon of the posterior muscle group at thigh level, right thigh, subsequent encounter: Secondary | ICD-10-CM | POA: Insufficient documentation

## 2020-02-08 DIAGNOSIS — S76301A Unspecified injury of muscle, fascia and tendon of the posterior muscle group at thigh level, right thigh, initial encounter: Secondary | ICD-10-CM

## 2020-02-08 NOTE — Assessment & Plan Note (Signed)
There appears to be a muscular disruption in the mid belly.  There does not appear to be a tendon association. -Counseled on home exercise therapy and supportive care. -Counseled on heat and compression. -Could consider physical therapy

## 2020-02-08 NOTE — Progress Notes (Signed)
Rodney Cummings - 68 y.o. male MRN [id number]  Date of birth: 07-Nov-1950  SUBJECTIVE:  Including CC & ROS.  Chief Complaint  Patient presents with  . Leg Injury    right x 02/06/2020    Rodney Cummings is a 69 y.o. male that is presenting with right hamstring pain.  He was getting hold with the ball while getting ready to stand up to water ski and felt a pop in his hamstring.  No history of similar pain.  Not having significant pain in the mid belly of the hamstring.  Having swelling and bruising.  Has trouble getting up from a seated position.  No history of surgery.  No history of similar pain.  Review of Systems See HPI   HISTORY: Past Medical, Surgical, Social, and Family History Reviewed & Updated per EMR.   Pertinent Historical Findings include:  Past Medical History:  Diagnosis Date  . Elevated PSA   . GERD (gastroesophageal reflux disease)   . Hearing loss     Past Surgical History:  Procedure Laterality Date  . SQUAMOUS CELL CARCINOMA EXCISION Right 2020   arm   . TONSILLECTOMY     age 80    Family History  Problem Relation Age of Onset  . Dementia Mother   . Cancer Father        throat/stomach  . Dementia Maternal Aunt   . Cancer Paternal Grandmother   . Cancer Paternal Grandfather   . Dementia Maternal Uncle     Social History   Socioeconomic History  . Marital status: Married    Spouse name: Not on file  . Number of children: Not on file  . Years of education: Not on file  . Highest education level: Not on file  Occupational History  . Not on file  Tobacco Use  . Smoking status: Never Smoker  . Smokeless tobacco: Current User    Types: Chew  Vaping Use  . Vaping Use: Never used  Substance and Sexual Activity  . Alcohol use: Yes    Alcohol/week: 0.0 standard drinks    Comment: "beer on weekends"   . Drug use: No  . Sexual activity: Yes  Other Topics Concern  . Not on file  Social History Narrative  . Not on file   Social Determinants of  Health   Financial Resource Strain: Low Risk   . Difficulty of Paying Living Expenses: Not hard at all  Food Insecurity: No Food Insecurity  . Worried About Charity fundraiser in the Last Year: Never true  . Ran Out of Food in the Last Year: Never true  Transportation Needs: No Transportation Needs  . Lack of Transportation (Medical): No  . Lack of Transportation (Non-Medical): No  Physical Activity: Insufficiently Active  . Days of Exercise per Week: 2 days  . Minutes of Exercise per Session: 20 min  Stress:   . Feeling of Stress : Not on file  Social Connections: Socially Integrated  . Frequency of Communication with Friends and Family: More than three times a week  . Frequency of Social Gatherings with Friends and Family: More than three times a week  . Attends Religious Services: More than 4 times per year  . Active Member of Clubs or Organizations: Yes  . Attends Archivist Meetings: More than 4 times per year  . Marital Status: Married  Human resources officer Violence:   . Fear of Current or Ex-Partner: Not on file  . Emotionally Abused: Not  on file  . Physically Abused: Not on file  . Sexually Abused: Not on file     PHYSICAL EXAM:  VS: BP 118/75   Pulse 67   Ht 6' (1.829 m)   Wt 195 lb (88.5 kg)   BMI 26.45 kg/m  Physical Exam Gen: NAD, alert, cooperative with exam, well-appearing MSK:  Right hamstring: Tenderness to palpation in the mid belly. Normal passive flexion extension of the knee. Pain with resistance to flexion. Normal internal and external rotation of the hip. Neurovascularly intact  Limited ultrasound: Right hamstring:  Normal appearing origin at the ischial tuberosity.  No disruption or hyperemia associated in this area. Increased hyperemia with an area of anechoic change in the mid belly of what appears to be semimembranosus.  This would represent a tear. No changes distally appreciated  Summary: Findings would suggest a partial tear of  the semimembranosus.  Ultrasound and interpretation by Clearance Coots, MD    ASSESSMENT & PLAN:   Hamstring strain, right, initial encounter There appears to be a muscular disruption in the mid belly.  There does not appear to be a tendon association. -Counseled on home exercise therapy and supportive care. -Counseled on heat and compression. -Could consider physical therapy

## 2020-02-08 NOTE — Patient Instructions (Signed)
Nice to meet you Please try compression  Please try heat  Please start with the range of motion movements and then to the strengthening when the pain.   Please send me a message in MyChart with any questions or updates.  Please see me back in 4 weeks.   --Dr. Raeford Razor

## 2020-02-29 ENCOUNTER — Ambulatory Visit (INDEPENDENT_AMBULATORY_CARE_PROVIDER_SITE_OTHER): Payer: Medicare Other | Admitting: Family Medicine

## 2020-02-29 ENCOUNTER — Encounter: Payer: Self-pay | Admitting: Family Medicine

## 2020-02-29 ENCOUNTER — Other Ambulatory Visit: Payer: Self-pay

## 2020-02-29 DIAGNOSIS — S76311D Strain of muscle, fascia and tendon of the posterior muscle group at thigh level, right thigh, subsequent encounter: Secondary | ICD-10-CM | POA: Diagnosis not present

## 2020-02-29 NOTE — Assessment & Plan Note (Signed)
Injury occurred on 8/29.  Initially had significant pain has improved with measures thus far.  Denies any deficits. -Counseled on home exercise therapy and supportive care. -Could consider physical therapy if needed. -Follow-up as needed.

## 2020-02-29 NOTE — Progress Notes (Signed)
Rodney Cummings - 69 y.o. male MRN [id number]  Date of birth: 12/27/1950  SUBJECTIVE:  Including CC & ROS.  Chief Complaint  Patient presents with  . Follow-up    right hamstring    Rodney Cummings is a 69 y.o. male that is following up for his right leg pain.  He has been doing well and getting back to his normal activities.  He has been doing the exercises and range of motion movements.  Denies any worsening pain.  Did have significant bruising about a week after being scanned.   Review of Systems See HPI   HISTORY: Past Medical, Surgical, Social, and Family History Reviewed & Updated per EMR.   Pertinent Historical Findings include:  Past Medical History:  Diagnosis Date  . Elevated PSA   . GERD (gastroesophageal reflux disease)   . Hearing loss     Past Surgical History:  Procedure Laterality Date  . SQUAMOUS CELL CARCINOMA EXCISION Right 2020   arm   . TONSILLECTOMY     age 63    Family History  Problem Relation Age of Onset  . Dementia Mother   . Cancer Father        throat/stomach  . Dementia Maternal Aunt   . Cancer Paternal Grandmother   . Cancer Paternal Grandfather   . Dementia Maternal Uncle     Social History   Socioeconomic History  . Marital status: Married    Spouse name: Not on file  . Number of children: Not on file  . Years of education: Not on file  . Highest education level: Not on file  Occupational History  . Not on file  Tobacco Use  . Smoking status: Never Smoker  . Smokeless tobacco: Current User    Types: Chew  Vaping Use  . Vaping Use: Never used  Substance and Sexual Activity  . Alcohol use: Yes    Alcohol/week: 0.0 standard drinks    Comment: "beer on weekends"   . Drug use: No  . Sexual activity: Yes  Other Topics Concern  . Not on file  Social History Narrative  . Not on file   Social Determinants of Health   Financial Resource Strain: Low Risk   . Difficulty of Paying Living Expenses: Not hard at all  Food  Insecurity: No Food Insecurity  . Worried About Charity fundraiser in the Last Year: Never true  . Ran Out of Food in the Last Year: Never true  Transportation Needs: No Transportation Needs  . Lack of Transportation (Medical): No  . Lack of Transportation (Non-Medical): No  Physical Activity: Insufficiently Active  . Days of Exercise per Week: 2 days  . Minutes of Exercise per Session: 20 min  Stress:   . Feeling of Stress : Not on file  Social Connections: Socially Integrated  . Frequency of Communication with Friends and Family: More than three times a week  . Frequency of Social Gatherings with Friends and Family: More than three times a week  . Attends Religious Services: More than 4 times per year  . Active Member of Clubs or Organizations: Yes  . Attends Archivist Meetings: More than 4 times per year  . Marital Status: Married  Human resources officer Violence:   . Fear of Current or Ex-Partner: Not on file  . Emotionally Abused: Not on file  . Physically Abused: Not on file  . Sexually Abused: Not on file     PHYSICAL EXAM:  VS: BP  125/83   Pulse 67   Ht 6' (1.829 m)   Wt 195 lb (88.5 kg)   BMI 26.45 kg/m  Physical Exam Gen: NAD, alert, cooperative with exam, well-appearing   ASSESSMENT & PLAN:   Hamstring strain, right, subsequent encounter Injury occurred on 8/29.  Initially had significant pain has improved with measures thus far.  Denies any deficits. -Counseled on home exercise therapy and supportive care. -Could consider physical therapy if needed. -Follow-up as needed.

## 2020-03-13 ENCOUNTER — Other Ambulatory Visit: Payer: Medicare Other

## 2020-03-13 ENCOUNTER — Telehealth: Payer: Self-pay

## 2020-03-13 DIAGNOSIS — E7800 Pure hypercholesterolemia, unspecified: Secondary | ICD-10-CM

## 2020-03-13 DIAGNOSIS — E663 Overweight: Secondary | ICD-10-CM | POA: Diagnosis not present

## 2020-03-13 DIAGNOSIS — R7309 Other abnormal glucose: Secondary | ICD-10-CM | POA: Diagnosis not present

## 2020-03-13 NOTE — Telephone Encounter (Signed)
Physical labs

## 2020-03-14 LAB — CBC WITH DIFFERENTIAL/PLATELET
Absolute Monocytes: 392 cells/uL (ref 200–950)
Basophils Absolute: 49 cells/uL (ref 0–200)
Basophils Relative: 1 %
Eosinophils Absolute: 475 cells/uL (ref 15–500)
Eosinophils Relative: 9.7 %
HCT: 42.8 % (ref 38.5–50.0)
Hemoglobin: 14.3 g/dL (ref 13.2–17.1)
Lymphs Abs: 2029 cells/uL (ref 850–3900)
MCH: 31.4 pg (ref 27.0–33.0)
MCHC: 33.4 g/dL (ref 32.0–36.0)
MCV: 94.1 fL (ref 80.0–100.0)
MPV: 9.9 fL (ref 7.5–12.5)
Monocytes Relative: 8 %
Neutro Abs: 1955 cells/uL (ref 1500–7800)
Neutrophils Relative %: 39.9 %
Platelets: 251 10*3/uL (ref 140–400)
RBC: 4.55 10*6/uL (ref 4.20–5.80)
RDW: 12 % (ref 11.0–15.0)
Total Lymphocyte: 41.4 %
WBC: 4.9 10*3/uL (ref 3.8–10.8)

## 2020-03-14 LAB — LIPID PANEL
Cholesterol: 203 mg/dL — ABNORMAL HIGH (ref <200)
HDL: 75 mg/dL (ref >=40)
LDL Cholesterol (Calc): 115 mg/dL (calc) — ABNORMAL HIGH
Non-HDL Cholesterol (Calc): 128 mg/dL (calc) (ref <130)
Total CHOL/HDL Ratio: 2.7 (calc) (ref <5.0)
Triglycerides: 51 mg/dL (ref <150)

## 2020-03-14 LAB — COMPREHENSIVE METABOLIC PANEL
AG Ratio: 1.8 (calc) (ref 1.0–2.5)
ALT: 15 U/L (ref 9–46)
AST: 17 U/L (ref 10–35)
Albumin: 4 g/dL (ref 3.6–5.1)
Alkaline phosphatase (APISO): 47 U/L (ref 35–144)
BUN: 14 mg/dL (ref 7–25)
CO2: 26 mmol/L (ref 20–32)
Calcium: 9 mg/dL (ref 8.6–10.3)
Chloride: 106 mmol/L (ref 98–110)
Creat: 1.02 mg/dL (ref 0.70–1.18)
Globulin: 2.2 g/dL (calc) (ref 1.9–3.7)
Glucose, Bld: 96 mg/dL (ref 65–99)
Potassium: 4.4 mmol/L (ref 3.5–5.3)
Sodium: 139 mmol/L (ref 135–146)
Total Bilirubin: 0.7 mg/dL (ref 0.2–1.2)
Total Protein: 6.2 g/dL (ref 6.1–8.1)

## 2020-03-14 LAB — HEMOGLOBIN A1C
Hgb A1c MFr Bld: 4.8 % of total Hgb (ref <5.7)
Mean Plasma Glucose: 91 (calc)
eAG (mmol/L): 5 (calc)

## 2020-03-16 ENCOUNTER — Other Ambulatory Visit: Payer: Self-pay | Admitting: Family Medicine

## 2020-03-16 ENCOUNTER — Ambulatory Visit (INDEPENDENT_AMBULATORY_CARE_PROVIDER_SITE_OTHER): Payer: Medicare Other | Admitting: Family Medicine

## 2020-03-16 ENCOUNTER — Encounter: Payer: Self-pay | Admitting: Family Medicine

## 2020-03-16 ENCOUNTER — Other Ambulatory Visit: Payer: Self-pay

## 2020-03-16 VITALS — BP 117/69 | HR 55 | Temp 97.1°F | Resp 16 | Ht 72.0 in | Wt 198.0 lb

## 2020-03-16 DIAGNOSIS — N401 Enlarged prostate with lower urinary tract symptoms: Secondary | ICD-10-CM | POA: Diagnosis not present

## 2020-03-16 DIAGNOSIS — R350 Frequency of micturition: Secondary | ICD-10-CM | POA: Diagnosis not present

## 2020-03-16 DIAGNOSIS — E663 Overweight: Secondary | ICD-10-CM | POA: Diagnosis not present

## 2020-03-16 DIAGNOSIS — M8949 Other hypertrophic osteoarthropathy, multiple sites: Secondary | ICD-10-CM | POA: Diagnosis not present

## 2020-03-16 DIAGNOSIS — K2100 Gastro-esophageal reflux disease with esophagitis, without bleeding: Secondary | ICD-10-CM | POA: Diagnosis not present

## 2020-03-16 DIAGNOSIS — M150 Primary generalized (osteo)arthritis: Secondary | ICD-10-CM

## 2020-03-16 DIAGNOSIS — M5136 Other intervertebral disc degeneration, lumbar region: Secondary | ICD-10-CM

## 2020-03-16 DIAGNOSIS — Z23 Encounter for immunization: Secondary | ICD-10-CM

## 2020-03-16 DIAGNOSIS — M51369 Other intervertebral disc degeneration, lumbar region without mention of lumbar back pain or lower extremity pain: Secondary | ICD-10-CM

## 2020-03-16 DIAGNOSIS — E7800 Pure hypercholesterolemia, unspecified: Secondary | ICD-10-CM | POA: Diagnosis not present

## 2020-03-16 DIAGNOSIS — M159 Polyosteoarthritis, unspecified: Secondary | ICD-10-CM

## 2020-03-16 MED ORDER — OMEPRAZOLE 40 MG PO CPDR: 40.0000 mg | DELAYED_RELEASE_CAPSULE | Freq: Every day | ORAL | 3 refills | Status: DC

## 2020-03-16 MED ORDER — ROSUVASTATIN CALCIUM 5 MG PO TABS: 5.0000 mg | ORAL_TABLET | Freq: Every day | ORAL | 3 refills | Status: DC

## 2020-03-16 NOTE — Assessment & Plan Note (Signed)
Encourage improvement lifestyle for wt loss

## 2020-03-16 NOTE — Assessment & Plan Note (Signed)
Controlled on Rapaflo, now with resolved nocturia Followed by [facility] Dr Bernardo Heater, following PSA

## 2020-03-16 NOTE — Progress Notes (Signed)
Subjective:    Patient ID: Maryann Alar, male    DOB: April 01, 1951, 69 y.o.   MRN: [id number]  ARISH REDNER is a 69 y.o. male presenting on 03/16/2020 for Osteoarthritis   HPI   HYPERLIPIDEMIA: Overweight BMI >26 - Reports no concerns. Last lipid panel 03/2020, controlled with LDL mild elevated 115 Not on statin therapy before Lifestyle Improving diet Gradual improving exercise now with his rehab for R hamstring  RIGHT Knee Osteoarthritis / Multiple joints Last visits 04/2019 and 01/2020 He has had R hamstring injury, has seen Sports Med and is on current treatment plan regimen, with gradual improvement.   Health Maintenance: UTD PFizer COVID vaccine 07/2019, eligible for booster when ready  Due for Flu Shot, will receive today    Depression screen [facility] 2/9 03/16/2020 11/02/2019 04/27/2019  Decreased Interest 0 0 0  Down, Depressed, Hopeless 0 0 0  PHQ - 2 Score 0 0 0  Altered sleeping - - 0  Tired, decreased energy - - 0  Change in appetite - - 0  Feeling bad or failure about yourself  - - 0  Trouble concentrating - - 0  Moving slowly or fidgety/restless - - 0  Suicidal thoughts - - 0  PHQ-9 Score - - 0    Past Medical History:  Diagnosis Date  . Elevated PSA   . GERD (gastroesophageal reflux disease)   . Hearing loss    Past Surgical History:  Procedure Laterality Date  . SQUAMOUS CELL CARCINOMA EXCISION Right 2020   arm   . TONSILLECTOMY     age 11   Social History   Socioeconomic History  . Marital status: Married    Spouse name: Not on file  . Number of children: Not on file  . Years of education: Not on file  . Highest education level: Not on file  Occupational History  . Not on file  Tobacco Use  . Smoking status: Never Smoker  . Smokeless tobacco: Current User    Types: Chew  Vaping Use  . Vaping Use: Never used  Substance and Sexual Activity  . Alcohol use: Yes    Alcohol/week: 0.0 standard drinks    Comment: "beer on weekends"   . Drug  use: No  . Sexual activity: Yes  Other Topics Concern  . Not on file  Social History Narrative  . Not on file   Social Determinants of Health   Financial Resource Strain: Low Risk   . Difficulty of Paying Living Expenses: Not hard at all  Food Insecurity: No Food Insecurity  . Worried About Charity fundraiser in the Last Year: Never true  . Ran Out of Food in the Last Year: Never true  Transportation Needs: No Transportation Needs  . Lack of Transportation (Medical): No  . Lack of Transportation (Non-Medical): No  Physical Activity: Insufficiently Active  . Days of Exercise per Week: 2 days  . Minutes of Exercise per Session: 20 min  Stress:   . Feeling of Stress : Not on file  Social Connections: Socially Integrated  . Frequency of Communication with Friends and Family: More than three times a week  . Frequency of Social Gatherings with Friends and Family: More than three times a week  . Attends Religious Services: More than 4 times per year  . Active Member of Clubs or Organizations: Yes  . Attends Archivist Meetings: More than 4 times per year  . Marital Status: Married  Human resources officer  Violence:   . Fear of Current or Ex-Partner: Not on file  . Emotionally Abused: Not on file  . Physically Abused: Not on file  . Sexually Abused: Not on file   Family History  Problem Relation Age of Onset  . Dementia Mother   . Cancer Father        throat/stomach  . Dementia Maternal Aunt   . Cancer Paternal Grandmother   . Cancer Paternal Grandfather   . Dementia Maternal Uncle    Current Outpatient Medications on File Prior to Visit  Medication Sig  . acetic acid-hydrocortisone (VOSOL-HC) OTIC solution Place 3 drops into both ears 3 (three) times daily. May use wick for first 24 hours  . Cholecalciferol (VITAMIN D3) 1000 units CAPS Take 5,000 Units by mouth.   Marland Kitchen GLUCOSAMINE HCL PO Take by mouth.  . Multiple Vitamin (MULTI-VITAMINS) TABS Take by mouth.  . silodosin  (RAPAFLO) 8 MG CAPS capsule Take 1 capsule (8 mg total) by mouth daily with breakfast.  . vitamin E 180 MG (400 UNITS) capsule Take by mouth.   No current facility-administered medications on file prior to visit.    Review of Systems  Constitutional: Negative for activity change, appetite change, chills, diaphoresis, fatigue and fever.  HENT: Negative for congestion and hearing loss.   Eyes: Negative for visual disturbance.  Respiratory: Negative for apnea, cough, chest tightness, shortness of breath and wheezing.   Cardiovascular: Negative for chest pain, palpitations and leg swelling.  Gastrointestinal: Negative for abdominal pain, anal bleeding, blood in stool, constipation, diarrhea, nausea and vomiting.  Endocrine: Negative for cold intolerance.  Genitourinary: Negative for difficulty urinating, dysuria, frequency and hematuria.  Musculoskeletal: Negative for arthralgias, back pain and neck pain.  Skin: Negative for rash.  Allergic/Immunologic: Negative for environmental allergies.  Neurological: Negative for dizziness, weakness, light-headedness, numbness and headaches.  Hematological: Negative for adenopathy.  Psychiatric/Behavioral: Negative for behavioral problems, dysphoric mood and sleep disturbance. The patient is not nervous/anxious.    Per HPI unless specifically indicated above      Objective:    BP 117/69   Pulse (!) 55   Temp (!) 97.1 F (36.2 C) (Temporal)   Resp 16   Ht 6' (1.829 m)   Wt 198 lb (89.8 kg)   SpO2 100%   BMI 26.85 kg/m   Wt Readings from Last 3 Encounters:  03/16/20 198 lb (89.8 kg)  02/29/20 195 lb (88.5 kg)  02/08/20 195 lb (88.5 kg)    Physical Exam Vitals and nursing note reviewed.  Constitutional:      General: He is not in acute distress.    Appearance: He is well-developed. He is not diaphoretic.     Comments: Well-appearing, comfortable, cooperative  HENT:     Head: Normocephalic and atraumatic.  Eyes:     General:         Right eye: No discharge.        Left eye: No discharge.     Conjunctiva/sclera: Conjunctivae normal.     Pupils: Pupils are equal, round, and reactive to light.  Neck:     Thyroid: No thyromegaly.     Vascular: No carotid bruit.  Cardiovascular:     Rate and Rhythm: Normal rate and regular rhythm.     Heart sounds: Normal heart sounds. No murmur heard.   Pulmonary:     Effort: Pulmonary effort is normal. No respiratory distress.     Breath sounds: Normal breath sounds. No wheezing or rales.  Abdominal:  General: Bowel sounds are normal. There is no distension.     Palpations: Abdomen is soft. There is no mass.     Tenderness: There is no abdominal tenderness.  Musculoskeletal:        General: No tenderness. Normal range of motion.     Cervical back: Normal range of motion and neck supple.     Right lower leg: No edema.     Left lower leg: No edema.     Comments: Upper / Lower Extremities: - Normal muscle tone, strength bilateral upper extremities 5/5, lower extremities 5/5  Lymphadenopathy:     Cervical: No cervical adenopathy.  Skin:    General: Skin is warm and dry.     Findings: No erythema or rash.  Neurological:     Mental Status: He is alert and oriented to person, place, and time.     Comments: Distal sensation intact to light touch all extremities  Psychiatric:        Behavior: Behavior normal.     Comments: Well groomed, good eye contact, normal speech and thoughts        Results for orders placed or performed in visit on 03/13/20  Lipid panel  Result Value Ref Range   Cholesterol 203 (H) <200 mg/dL   HDL 75 > OR = 40 mg/dL   Triglycerides 51 <150 mg/dL   LDL Cholesterol (Calc) 115 (H) mg/dL (calc)   Total CHOL/HDL Ratio 2.7 <5.0 (calc)   Non-HDL Cholesterol (Calc) 128 <130 mg/dL (calc)  Hemoglobin A1c  Result Value Ref Range   Hgb A1c MFr Bld 4.8 <5.7 % of total Hgb   Mean Plasma Glucose 91 (calc)   eAG (mmol/L) 5.0 (calc)  Comprehensive metabolic  panel  Result Value Ref Range   Glucose, Bld 96 65 - 99 mg/dL   BUN 14 7 - 25 mg/dL   Creat 1.02 0.70 - 1.18 mg/dL   BUN/Creatinine Ratio NOT APPLICABLE 6 - 22 (calc)   Sodium 139 135 - 146 mmol/L   Potassium 4.4 3.5 - 5.3 mmol/L   Chloride 106 98 - 110 mmol/L   CO2 26 20 - 32 mmol/L   Calcium 9.0 8.6 - 10.3 mg/dL   Total Protein 6.2 6.1 - 8.1 g/dL   Albumin 4.0 3.6 - 5.1 g/dL   Globulin 2.2 1.9 - 3.7 g/dL (calc)   AG Ratio 1.8 1.0 - 2.5 (calc)   Total Bilirubin 0.7 0.2 - 1.2 mg/dL   Alkaline phosphatase (APISO) 47 35 - 144 U/L   AST 17 10 - 35 U/L   ALT 15 9 - 46 U/L  CBC with Differential/Platelet  Result Value Ref Range   WBC 4.9 3.8 - 10.8 Thousand/uL   RBC 4.55 4.20 - 5.80 Million/uL   Hemoglobin 14.3 13.2 - 17.1 g/dL   HCT 42.8 38 - 50 %   MCV 94.1 80.0 - 100.0 fL   MCH 31.4 27.0 - 33.0 pg   MCHC 33.4 32.0 - 36.0 g/dL   RDW 12.0 11.0 - 15.0 %   Platelets 251 140 - 400 Thousand/uL   MPV 9.9 7.5 - 12.5 fL   Neutro Abs 1,955 1,500 - 7,800 cells/uL   Lymphs Abs 2,029 850 - 3,900 cells/uL   Absolute Monocytes 392 200 - 950 cells/uL   Eosinophils Absolute 475 15 - 500 cells/uL   Basophils Absolute 49 0 - 200 cells/uL   Neutrophils Relative % 39.9 %   Total Lymphocyte 41.4 %   Monocytes Relative 8.0 %  Eosinophils Relative 9.7 %   Basophils Relative 1.0 %      Assessment & Plan:   Problem List Items Addressed This Visit    Primary osteoarthritis involving multiple joints - Primary    Chronic stable problem Continue current conservative regimen Improving R hamstring injury      Overweight (BMI 25.0-29.9)    Encourage improvement lifestyle for wt loss      Hyperlipidemia    Improved LDL down to 115 Last lipid panel 02/2020 The 10-year ASCVD risk score Mikey Bussing DC Jr., et al., 2013) is: 13.2% Failed Aspirin 81mg  due to easy bleeding  Plan: 1. Discussion on ASCVD risk again and prevention - START new statin with Rosuvastatin 5mg  nightly, counseling on dosing,  titration side effect benefits 2. Encourage improved lifestyle - low carb/cholesterol, reduce portion size, continue improving regular exercise  F/u 3 month CMET + LIPID virtual visit      Relevant Medications   rosuvastatin (CRESTOR) 5 MG tablet   Gastroesophageal reflux disease with esophagitis    Stable Controlled Refilled Omeprazole 40mg  daily      Relevant Medications   omeprazole (PRILOSEC) 40 MG capsule   DDD (degenerative disc disease), lumbar   Benign prostatic hyperplasia with urinary frequency    Controlled on Rapaflo, now with resolved nocturia Followed by [facility] Dr Bernardo Heater, following PSA       Other Visit Diagnoses    Needs flu shot       Relevant Orders   Flu Vaccine QUAD High Dose(Fluad) (Completed)      Updated Health Maintenance information - Flu shot today, high dose - Plan to get Pfizer covid booster when ready - UTD PSA screening per Urology - UTD colon ca screening Reviewed recent lab results with patient Encouraged improvement to lifestyle with diet and exercise - Goal of weight loss   Meds ordered this encounter  Medications  . rosuvastatin (CRESTOR) 5 MG tablet    Sig: Take 1 tablet (5 mg total) by mouth at bedtime.    Dispense:  90 tablet    Refill:  3  . omeprazole (PRILOSEC) 40 MG capsule    Sig: Take 1 capsule (40 mg total) by mouth daily.    Dispense:  90 capsule    Refill:  3    Add refills      Follow up plan: Return in about 3 months (around 06/16/2020) for 3 month fasting lab only then 1 week later Virtual Telephone Cholesterol results.  Future labs ordered CMET + Lipid - virtual 3 month  Nobie Putnam, DO [facility] 03/16/2020, 9:24 AM

## 2020-03-16 NOTE — Patient Instructions (Addendum)
Thank you for coming to the office today.  Booster for [place] 1-2 weeks.  You are at increased risk of future Cardiovascular complications such as Heart Attack or Stroke from an artery blockage due to abnormal cholesterol and/or risk factors. - As discussed, Statin Cholesterol pills both can both LOWER cholesterol and REDUCE this future risk of heart attack and stroke - Start Rosuvastatin (generic Crestor) 5mg  pill once at bedtime every night  If you develop mild aches or pains in muscle or joint that does NOT improve or go away after first 3-4 weeks then this may require [place] to adjust the dose. First I would recommend STOPPING the medication for a few weeks until your ache and pain symptoms completely RESOLVE. Then you can restart at a LOWER DOSE either HALF a pill at bedtime every night or LESS OFTEN such as one pill a week only and then gradually increase to every other day or max dose of 3 times a week  Lastly, sometimes we need to try other versions of this medicine to find one that works for you and does not cause side effects.  ----- Try small amount of topical cortisone on the areas of skin we discussed, and use it 1-2 times a day for 1-2 weeks then stop, if not resolving we can consider a Dermatologist. - it may be from sun exposure and thin skin  DUE for FASTING BLOOD WORK (no food or drink after midnight before the lab appointment, only water or coffee without cream/sugar on the morning of)  SCHEDULE "Lab Only" visit in the morning at the clinic for lab draw in 3 MONTHS   - Make sure Lab Only appointment is at about 1 week before your next appointment, so that results will be available  For Lab Results, once available within 2-3 days of blood draw, you can can log in to MyChart online to view your results and a brief explanation. Also, we can discuss results at next follow-up visit.   Please schedule a Follow-up Appointment to: Return in about 3 months (around 06/16/2020) for 3  month fasting lab only then 1 week later Virtual Telephone Cholesterol results.  If you have any other questions or concerns, please feel free to call the office or send a message through [facility]. You may also schedule an earlier appointment if necessary.  Additionally, you may be receiving a survey about your experience at our office within a few days to 1 week by e-mail or mail. We value your feedback.  Nobie Putnam, DO [facility]

## 2020-03-16 NOTE — Assessment & Plan Note (Signed)
Improved LDL down to 115 Last lipid panel 02/2020 The 10-year ASCVD risk score Mikey Bussing DC Jr., et al., 2013) is: 13.2% Failed Aspirin 81mg  due to easy bleeding  Plan: 1. Discussion on ASCVD risk again and prevention - START new statin with Rosuvastatin 5mg  nightly, counseling on dosing, titration side effect benefits 2. Encourage improved lifestyle - low carb/cholesterol, reduce portion size, continue improving regular exercise  F/u 3 month CMET + LIPID virtual visit

## 2020-03-16 NOTE — Assessment & Plan Note (Addendum)
Stable Controlled Refilled Omeprazole 40mg  daily

## 2020-03-16 NOTE — Assessment & Plan Note (Signed)
Chronic stable problem Continue current conservative regimen Improving R hamstring injury

## 2020-07-04 ENCOUNTER — Other Ambulatory Visit: Payer: Self-pay

## 2020-07-04 ENCOUNTER — Other Ambulatory Visit: Payer: Medicare Other

## 2020-07-04 DIAGNOSIS — E7800 Pure hypercholesterolemia, unspecified: Secondary | ICD-10-CM | POA: Diagnosis not present

## 2020-07-05 LAB — LIPID PANEL
Cholesterol: 139 mg/dL (ref <200)
HDL: 60 mg/dL (ref >=40)
LDL Cholesterol (Calc): 66 mg/dL (calc)
Non-HDL Cholesterol (Calc): 79 mg/dL (calc) (ref <130)
Total CHOL/HDL Ratio: 2.3 (calc) (ref <5.0)
Triglycerides: 51 mg/dL (ref <150)

## 2020-07-05 LAB — COMPLETE METABOLIC PANEL WITH GFR
AG Ratio: 1.8 (calc) (ref 1.0–2.5)
ALT: 23 U/L (ref 9–46)
AST: 19 U/L (ref 10–35)
Albumin: 4.2 g/dL (ref 3.6–5.1)
Alkaline phosphatase (APISO): 49 U/L (ref 35–144)
BUN: 16 mg/dL (ref 7–25)
CO2: 25 mmol/L (ref 20–32)
Calcium: 9.2 mg/dL (ref 8.6–10.3)
Chloride: 105 mmol/L (ref 98–110)
Creat: 1.1 mg/dL (ref 0.70–1.18)
GFR, Est African American: 78 mL/min/{1.73_m2} (ref >=60)
GFR, Est Non African American: 68 mL/min/{1.73_m2} (ref >=60)
Globulin: 2.4 g/dL (calc) (ref 1.9–3.7)
Glucose, Bld: 90 mg/dL (ref 65–99)
Potassium: 4.2 mmol/L (ref 3.5–5.3)
Sodium: 140 mmol/L (ref 135–146)
Total Bilirubin: 0.5 mg/dL (ref 0.2–1.2)
Total Protein: 6.6 g/dL (ref 6.1–8.1)

## 2020-07-26 DIAGNOSIS — Z872 Personal history of diseases of the skin and subcutaneous tissue: Secondary | ICD-10-CM | POA: Diagnosis not present

## 2020-07-26 DIAGNOSIS — L578 Other skin changes due to chronic exposure to nonionizing radiation: Secondary | ICD-10-CM | POA: Diagnosis not present

## 2020-07-26 DIAGNOSIS — Z859 Personal history of malignant neoplasm, unspecified: Secondary | ICD-10-CM | POA: Diagnosis not present

## 2020-07-26 DIAGNOSIS — G548 Other nerve root and plexus disorders: Secondary | ICD-10-CM | POA: Diagnosis not present

## 2020-07-26 DIAGNOSIS — L570 Actinic keratosis: Secondary | ICD-10-CM | POA: Diagnosis not present

## 2020-07-26 DIAGNOSIS — L821 Other seborrheic keratosis: Secondary | ICD-10-CM | POA: Diagnosis not present

## 2020-07-26 DIAGNOSIS — K130 Diseases of lips: Secondary | ICD-10-CM | POA: Diagnosis not present

## 2020-07-26 DIAGNOSIS — Z85828 Personal history of other malignant neoplasm of skin: Secondary | ICD-10-CM | POA: Diagnosis not present

## 2020-11-07 ENCOUNTER — Other Ambulatory Visit: Payer: Self-pay | Admitting: Family Medicine

## 2020-11-07 ENCOUNTER — Ambulatory Visit (INDEPENDENT_AMBULATORY_CARE_PROVIDER_SITE_OTHER): Payer: Medicare Other

## 2020-11-07 VITALS — Ht 72.0 in | Wt 191.0 lb

## 2020-11-07 DIAGNOSIS — Z0000 Encounter for general adult medical examination without abnormal findings: Secondary | ICD-10-CM

## 2020-11-07 DIAGNOSIS — H60543 Acute eczematoid otitis externa, bilateral: Secondary | ICD-10-CM

## 2020-11-07 MED ORDER — HYDROCORTISONE-ACETIC ACID 1-2 % OT SOLN: 3.0000 [drp] | Freq: Three times a day (TID) | OTIC | 2 refills | Status: DC

## 2020-11-07 NOTE — Progress Notes (Signed)
I connected with Rodney Cummings today by telephone and verified that I am speaking with the correct person using two identifiers. Location patient: home Location provider: work Persons participating in the virtual visit: Jeven Topper, Glenna Durand LPN.   I discussed the limitations, risks, security and privacy concerns of performing an evaluation and management service by telephone and the availability of in person appointments. I also discussed with the patient that there may be a patient responsible charge related to this service. The patient expressed understanding and verbally consented to this telephonic visit.    Interactive audio and video telecommunications were attempted between this provider and patient, however failed, due to patient having technical difficulties OR patient did not have access to video capability.  We continued and completed visit with audio only.     Vital signs may be patient reported or missing.  Subjective:   Rodney Cummings is a 70 y.o. male who presents for Medicare Annual/Subsequent preventive examination.  Review of Systems     Cardiac Risk Factors include: advanced age (>73men, >75 women);hypertension;male gender     Objective:    Today's Vitals   11/07/20 1316 11/07/20 1317  Weight: 191 lb (86.6 kg)   Height: 6' (1.829 m)   PainSc:  2    Body mass index is 25.9 kg/m.  Advanced Directives 11/07/2020 11/02/2019  Does Patient Have a Medical Advance Directive? Yes Yes  Type of Paramedic of [place];Living will Living will;Healthcare Power of Rodney Cummings in Chart? No - copy requested No - copy requested    Current Medications (verified) Outpatient Encounter Medications as of 11/07/2020  Medication Sig  . acetic acid-hydrocortisone (VOSOL-HC) OTIC solution Place 3 drops into both ears 3 (three) times daily. May use wick for first 24 hours  . Cholecalciferol (VITAMIN D3) 1000 units CAPS  Take 5,000 Units by mouth.   Marland Kitchen GLUCOSAMINE HCL PO Take by mouth.  . Multiple Vitamin (MULTI-VITAMINS) TABS Take by mouth.  Marland Kitchen omeprazole (PRILOSEC) 40 MG capsule Take 1 capsule (40 mg total) by mouth daily.  . rosuvastatin (CRESTOR) 5 MG tablet Take 1 tablet (5 mg total) by mouth at bedtime.  . silodosin (RAPAFLO) 8 MG CAPS capsule Take 1 capsule (8 mg total) by mouth daily with breakfast.  . vitamin E 180 MG (400 UNITS) capsule Take by mouth.   No facility-administered encounter medications on file as of 11/07/2020.    Allergies (verified) Patient has no known allergies.   History: Past Medical History:  Diagnosis Date  . Elevated PSA   . GERD (gastroesophageal reflux disease)   . Hearing loss    Past Surgical History:  Procedure Laterality Date  . SQUAMOUS CELL CARCINOMA EXCISION Right 2020   arm   . TONSILLECTOMY     age 62   Family History  Problem Relation Age of Onset  . Dementia Mother   . Cancer Father        throat/stomach  . Dementia Maternal Aunt   . Cancer Paternal Grandmother   . Cancer Paternal Grandfather   . Dementia Maternal Uncle    Social History   Socioeconomic History  . Marital status: Married    Spouse name: Not on file  . Number of children: Not on file  . Years of education: Not on file  . Highest education level: Not on file  Occupational History  . Occupation: retired  Tobacco Use  . Smoking status: Never Smoker  . Smokeless  tobacco: Current User    Types: Chew  Vaping Use  . Vaping Use: Never used  Substance and Sexual Activity  . Alcohol use: Yes    Alcohol/week: 0.0 standard drinks    Comment: "beer on weekends"   . Drug use: No  . Sexual activity: Yes  Other Topics Concern  . Not on file  Social History Narrative  . Not on file   Social Determinants of Health   Financial Resource Strain: Low Risk   . Difficulty of Paying Living Expenses: Not hard at all  Food Insecurity: No Food Insecurity  . Worried About Ship broker in the Last Year: Never true  . Ran Out of Food in the Last Year: Never true  Transportation Needs: No Transportation Needs  . Lack of Transportation (Medical): No  . Lack of Transportation (Non-Medical): No  Physical Activity: Inactive  . Days of Exercise per Week: 0 days  . Minutes of Exercise per Session: 0 min  Stress: No Stress Concern Present  . Feeling of Stress : Not at all  Social Connections: Not on file    Tobacco Counseling Ready to quit: Not Answered Counseling given: Not Answered   Clinical Intake:  Pre-visit preparation completed: Yes  Pain : 0-10 Pain Score: 2  Pain Type: Chronic pain Pain Location: Hand Pain Orientation: Left Pain Descriptors / Indicators: Aching Pain Frequency: Constant Pain Relieving Factors: heat helps  Pain Relieving Factors: heat helps  Nutritional Status: BMI 25 -29 Overweight Nutritional Risks: None Diabetes: No  How often do you need to have someone help you when you read instructions, pamphlets, or other written materials from your doctor or pharmacy?: 1 - Never What is the last grade level you completed in school?: 12th grade  Diabetic? no  Interpreter Needed?: No  Information entered by :: NAllen LPN   Activities of Daily Living In your present state of health, do you have any difficulty performing the following activities: 11/07/2020  Hearing? Y  Comment has hearing aides  Vision? N  Difficulty concentrating or making decisions? N  Walking or climbing stairs? N  Dressing or bathing? N  Doing errands, shopping? N  Preparing Food and eating ? N  Using the Toilet? N  In the past six months, have you accidently leaked urine? N  Do you have problems with loss of bowel control? N  Managing your Medications? N  Managing your Finances? N  Housekeeping or managing your Housekeeping? N  Some recent data might be hidden    Patient Care Team: Olin Hauser, DO as PCP - General (Family  Medicine)  Indicate any recent Medical Services you may have received from other than [facility] providers in the past year (date may be approximate).     Assessment:   This is a routine wellness examination for Rodney Cummings.  Hearing/Vision screen No exam data present  Dietary issues and exercise activities discussed: Current Exercise Habits: The patient has a physically strenuous job, but has no regular exercise apart from work.  Goals Addressed            This Visit's Progress   . Patient Stated       11/07/2020, maintain health      Depression Screen PHQ 2/9 Scores 11/07/2020 03/16/2020 11/02/2019 04/27/2019 03/16/2019 07/15/2018 06/17/2018  PHQ - 2 Score 0 0 0 0 0 0 0  PHQ- 9 Score - - - 0 - - -    Fall Risk Fall Risk  11/07/2020 03/16/2020  11/02/2019 03/16/2019 03/16/2019  Falls in the past year? 0 0 0 0 0  Number falls in past yr: - 0 0 0 0  Injury with Fall? - 0 0 - -  Risk for fall due to : Medication side effect - - - History of fall(s)  Follow up Falls evaluation completed;Education provided;Falls prevention discussed Falls evaluation completed - - -    FALL RISK PREVENTION PERTAINING TO THE HOME:  Any stairs in or around the home? Yes  If so, are there any without handrails? No  Home free of loose throw rugs in walkways, pet beds, electrical cords, etc? Yes  Adequate lighting in your home to reduce risk of falls? Yes   ASSISTIVE DEVICES UTILIZED TO PREVENT FALLS:  Life alert? No  Use of a cane, walker or w/c? No  Grab bars in the bathroom? No  Shower chair or bench in shower? No  Elevated toilet seat or a handicapped toilet? No   TIMED UP AND GO:  Was the test performed? No .    Cognitive Function:     6CIT Screen 11/07/2020  What Year? 0 points  What month? 0 points  What time? 0 points  Count back from 20 0 points  Months in reverse 4 points  Repeat phrase 2 points  Total Score 6    Immunizations Immunization History  Administered Date(s) Administered  .  Fluad Quad(high Dose 65+) 03/16/2019, 03/16/2020  . Influenza, High Dose Seasonal PF 04/23/2016, 04/17/2017, 03/18/2018  . PFIZER(Purple Top)SARS-COV-2 Vaccination 07/12/2019, 08/01/2019  . Pneumococcal Conjugate-13 10/17/2015  . Tdap 10/17/2015    TDAP status: Up to date  Flu Vaccine status: Up to date  Pneumococcal vaccine status: Up to date  Covid-19 vaccine status: Completed vaccines  Qualifies for Shingles Vaccine? Yes   Zostavax completed No   Shingrix Completed?: No.    Education has been provided regarding the importance of this vaccine. Patient has been advised to call insurance company to determine out of pocket expense if they have not yet received this vaccine. Advised may also receive vaccine at local pharmacy or Health Dept. Verbalized acceptance and understanding.  Screening Tests Health Maintenance  Topic Date Due  . Zoster Vaccines- Shingrix (1 of 2) Never done  . COVID-19 Vaccine (3 - Pfizer risk 4-dose series) 08/29/2019  . INFLUENZA VACCINE  01/08/2021  . COLONOSCOPY (Pts 45-14yrs Insurance coverage will need to be confirmed)  10/28/2023  . TETANUS/TDAP  10/16/2025  . Hepatitis C Screening  Completed  . PNA vac Low Risk Adult  Completed  . HPV VACCINES  Aged Out    Health Maintenance  Health Maintenance Due  Topic Date Due  . Zoster Vaccines- Shingrix (1 of 2) Never done  . COVID-19 Vaccine (3 - Pfizer risk 4-dose series) 08/29/2019    Colorectal cancer screening: Type of screening: Colonoscopy. Completed 11/01/2013. Repeat every 10 years  Lung Cancer Screening: (Low Dose CT Chest recommended if Age 80-80 years, 30 pack-year currently smoking OR have quit w/in 15years.) does not qualify.   Lung Cancer Screening Referral: no  Additional Screening:  Hepatitis C Screening: does qualify; Completed 01/15/2017  Vision Screening: Recommended annual ophthalmology exams for early detection of glaucoma and other disorders of the eye. Is the patient up to date  with their annual eye exam?  No  Who is the provider or what is the name of the office in which the patient attends annual eye exams? Dr. Ellin Mayhew If pt is not established with a provider,  would they like to be referred to a provider to establish care? No .   Dental Screening: Recommended annual dental exams for proper oral hygiene  Community Resource Referral / Chronic Care Management: CRR required this visit?  No   CCM required this visit?  No      Plan:     I have personally reviewed and noted the following in the patient's chart:   . Medical and social history . Use of alcohol, tobacco or illicit drugs  . Current medications and supplements including opioid prescriptions. Patient is not currently taking opioid prescriptions. . Functional ability and status . Nutritional status . Physical activity . Advanced directives . List of other physicians . Hospitalizations, surgeries, and ER visits in previous 12 months . Vitals . Screenings to include cognitive, depression, and falls . Referrals and appointments  In addition, I have reviewed and discussed with patient certain preventive protocols, quality metrics, and best practice recommendations. A written personalized care plan for preventive services as well as general preventive health recommendations were provided to patient.     Kellie Simmering, LPN   0/22/1798   Nurse Notes:

## 2020-11-07 NOTE — Patient Instructions (Signed)
Rodney Cummings , Thank you for taking time to come for your Medicare Wellness Visit. I appreciate your ongoing commitment to your health goals. Please review the following plan we discussed and let me know if I can assist you in the future.   Screening recommendations/referrals: Colonoscopy: completed 11/01/2013 Recommended yearly ophthalmology/optometry visit for glaucoma screening and checkup Recommended yearly dental visit for hygiene and checkup  Vaccinations: Influenza vaccine: completed 03/16/2020, due 01/08/2021 Pneumococcal vaccine: completed 10/17/2015 Tdap vaccine: completed 10/17/2015 Shingles vaccine: discussed   Covid-19: 08/01/2019, 07/12/2019  Advanced directives: Please bring a copy of your POA (Power of Attorney) and/or Living Will to your next appointment.   Conditions/risks identified: chews tobacco  Next appointment: Follow up in one year for your annual wellness visit.   Preventive Care 72 Years and Older, Male Preventive care refers to lifestyle choices and visits with your health care provider that can promote health and wellness. What does preventive care include?  A yearly physical exam. This is also called an annual well check.  Dental exams once or twice a year.  Routine eye exams. Ask your health care provider how often you should have your eyes checked.  Personal lifestyle choices, including:  Daily care of your teeth and gums.  Regular physical activity.  Eating a healthy diet.  Avoiding tobacco and drug use.  Limiting alcohol use.  Practicing safe sex.  Taking low doses of aspirin every day.  Taking vitamin and mineral supplements as recommended by your health care provider. What happens during an annual well check? The services and screenings done by your health care provider during your annual well check will depend on your age, overall health, lifestyle risk factors, and family history of disease. Counseling  Your health care provider may ask you  questions about your:  Alcohol use.  Tobacco use.  Drug use.  Emotional well-being.  Home and relationship well-being.  Sexual activity.  Eating habits.  History of falls.  Memory and ability to understand (cognition).  Work and work Statistician. Screening  You may have the following tests or measurements:  Height, weight, and BMI.  Blood pressure.  Lipid and cholesterol levels. These may be checked every 5 years, or more frequently if you are over 73 years old.  Skin check.  Lung cancer screening. You may have this screening every year starting at age 81 if you have a 30-pack-year history of smoking and currently smoke or have quit within the past 15 years.  Fecal occult blood test (FOBT) of the stool. You may have this test every year starting at age 81.  Flexible sigmoidoscopy or colonoscopy. You may have a sigmoidoscopy every 5 years or a colonoscopy every 10 years starting at age 33.  Prostate cancer screening. Recommendations will vary depending on your family history and other risks.  Hepatitis C blood test.  Hepatitis B blood test.  Sexually transmitted disease (STD) testing.  Diabetes screening. This is done by checking your blood sugar (glucose) after you have not eaten for a while (fasting). You may have this done every 1-3 years.  Abdominal aortic aneurysm (AAA) screening. You may need this if you are a current or former smoker.  Osteoporosis. You may be screened starting at age 84 if you are at high risk. Talk with your health care provider about your test results, treatment options, and if necessary, the need for more tests. Vaccines  Your health care provider may recommend certain vaccines, such as:  Influenza vaccine. This is recommended every  year.  Tetanus, diphtheria, and acellular pertussis (Tdap, Td) vaccine. You may need a Td booster every 10 years.  Zoster vaccine. You may need this after age 71.  Pneumococcal 13-valent conjugate  (PCV13) vaccine. One dose is recommended after age 86.  Pneumococcal polysaccharide (PPSV23) vaccine. One dose is recommended after age 40. Talk to your health care provider about which screenings and vaccines you need and how often you need them. This information is not intended to replace advice given to you by your health care provider. Make sure you discuss any questions you have with your health care provider. Document Released: 06/23/2015 Document Revised: 02/14/2016 Document Reviewed: 03/28/2015 Elsevier Interactive Patient Education  2017 [place] Prevention in the Home Falls can cause injuries. They can happen to people of all ages. There are many things you can do to make your home safe and to help prevent falls. What can I do on the outside of my home?  Regularly fix the edges of walkways and driveways and fix any cracks.  Remove anything that might make you trip as you walk through a door, such as a raised step or threshold.  Trim any bushes or trees on the path to your home.  Use bright outdoor lighting.  Clear any walking paths of anything that might make someone trip, such as rocks or tools.  Regularly check to see if handrails are loose or broken. Make sure that both sides of any steps have handrails.  Any raised decks and porches should have guardrails on the edges.  Have any leaves, snow, or ice cleared regularly.  Use sand or salt on walking paths during winter.  Clean up any spills in your garage right away. This includes oil or grease spills. What can I do in the bathroom?  Use night lights.  Install grab bars by the toilet and in the tub and shower. Do not use towel bars as grab bars.  Use non-skid mats or decals in the tub or shower.  If you need to sit down in the shower, use a plastic, non-slip stool.  Keep the floor dry. Clean up any water that spills on the floor as soon as it happens.  Remove soap buildup in the tub or shower  regularly.  Attach bath mats securely with double-sided non-slip rug tape.  Do not have throw rugs and other things on the floor that can make you trip. What can I do in the bedroom?  Use night lights.  Make sure that you have a light by your bed that is easy to reach.  Do not use any sheets or blankets that are too big for your bed. They should not hang down onto the floor.  Have a firm chair that has side arms. You can use this for support while you get dressed.  Do not have throw rugs and other things on the floor that can make you trip. What can I do in the kitchen?  Clean up any spills right away.  Avoid walking on wet floors.  Keep items that you use a lot in easy-to-reach places.  If you need to reach something above you, use a strong step stool that has a grab bar.  Keep electrical cords out of the way.  Do not use floor polish or wax that makes floors slippery. If you must use wax, use non-skid floor wax.  Do not have throw rugs and other things on the floor that can make you trip. What can  I do with my stairs?  Do not leave any items on the stairs.  Make sure that there are handrails on both sides of the stairs and use them. Fix handrails that are broken or loose. Make sure that handrails are as long as the stairways.  Check any carpeting to make sure that it is firmly attached to the stairs. Fix any carpet that is loose or worn.  Avoid having throw rugs at the top or bottom of the stairs. If you do have throw rugs, attach them to the floor with carpet tape.  Make sure that you have a light switch at the top of the stairs and the bottom of the stairs. If you do not have them, ask someone to add them for you. What else can I do to help prevent falls?  Wear shoes that:  Do not have high heels.  Have rubber bottoms.  Are comfortable and fit you well.  Are closed at the toe. Do not wear sandals.  If you use a stepladder:  Make sure that it is fully  opened. Do not climb a closed stepladder.  Make sure that both sides of the stepladder are locked into place.  Ask someone to hold it for you, if possible.  Clearly mark and make sure that you can see:  Any grab bars or handrails.  First and last steps.  Where the edge of each step is.  Use tools that help you move around (mobility aids) if they are needed. These include:  Canes.  Walkers.  Scooters.  Crutches.  Turn on the lights when you go into a dark area. Replace any light bulbs as soon as they burn out.  Set up your furniture so you have a clear path. Avoid moving your furniture around.  If any of your floors are uneven, fix them.  If there are any pets around you, be aware of where they are.  Review your medicines with your doctor. Some medicines can make you feel dizzy. This can increase your chance of falling. Ask your doctor what other things that you can do to help prevent falls. This information is not intended to replace advice given to you by your health care provider. Make sure you discuss any questions you have with your health care provider. Document Released: 03/23/2009 Document Revised: 11/02/2015 Document Reviewed: 07/01/2014 Elsevier Interactive Patient Education  2017 [employer].

## 2021-01-15 ENCOUNTER — Other Ambulatory Visit: Payer: Self-pay

## 2021-01-15 ENCOUNTER — Other Ambulatory Visit: Payer: Medicare Other

## 2021-01-15 DIAGNOSIS — R972 Elevated prostate specific antigen [PSA]: Secondary | ICD-10-CM | POA: Diagnosis not present

## 2021-01-16 LAB — PSA: Prostate Specific Ag, Serum: 4 ng/mL (ref 0.0–4.0)

## 2021-01-18 ENCOUNTER — Other Ambulatory Visit: Payer: Self-pay

## 2021-01-18 ENCOUNTER — Ambulatory Visit: Payer: Self-pay | Admitting: Urology

## 2021-01-18 ENCOUNTER — Encounter: Payer: Medicare Other | Admitting: Urology

## 2021-01-18 ENCOUNTER — Encounter: Payer: Self-pay | Admitting: Urology

## 2021-01-19 ENCOUNTER — Ambulatory Visit (INDEPENDENT_AMBULATORY_CARE_PROVIDER_SITE_OTHER): Payer: Medicare Other | Admitting: Urology

## 2021-01-19 VITALS — BP 108/65 | HR 59 | Ht 72.0 in | Wt 195.0 lb

## 2021-01-19 DIAGNOSIS — N4340 Spermatocele of epididymis, unspecified: Secondary | ICD-10-CM | POA: Diagnosis not present

## 2021-01-19 DIAGNOSIS — Z87898 Personal history of other specified conditions: Secondary | ICD-10-CM

## 2021-01-19 DIAGNOSIS — N401 Enlarged prostate with lower urinary tract symptoms: Secondary | ICD-10-CM | POA: Diagnosis not present

## 2021-01-19 DIAGNOSIS — R350 Frequency of micturition: Secondary | ICD-10-CM | POA: Diagnosis not present

## 2021-01-19 MED ORDER — SILODOSIN 8 MG PO CAPS: 8.0000 mg | ORAL_CAPSULE | Freq: Every day | ORAL | 3 refills | Status: DC

## 2021-01-19 NOTE — Progress Notes (Signed)
01/19/2021 10:15 AM   Rodney Cummings May 04, 1951 [id number]  Referring provider: Olin Hauser, DO [address] [place],  [place] [address]  Chief Complaint  Patient presents with   Elevated PSA    1 year follow up    Urologic history: 1.  Elevated PSA Benign Prostate Biopsy (~2007) following mid 4 range PSA  PSA up to 6.2 with MRI revealing PI-RADS 3/PI-RADS 4 lesions with 56-gram prostate volume.  Fusion biopsy (12/30/2017) at [facility]. Pathology: Standard 12 core biopsy and targeted lesions all showed benign prostate tissue.  One core from the targeted biopsy did show focal chronic inflammatory change. Last PSA (07/07/2018) was 3.7   2.  BPH with lower urinary tract symptoms Currently on silodosin   3.  History of stone disease s/p endoscopic stone removal in 1986  4.  Left spermatocele   HPI: 70 y.o. male presents for annual follow-up.  Has done well since last years visit and has no complaints Stable LUTS on silodosin Denies dysuria, gross hematuria No flank, abdominal or pelvic pain PSA 01/15/2021 stable at 4.0   PMH: Past Medical History:  Diagnosis Date   Elevated PSA    GERD (gastroesophageal reflux disease)    Hearing loss     Surgical History: Past Surgical History:  Procedure Laterality Date   SQUAMOUS CELL CARCINOMA EXCISION Right 2020   arm    TONSILLECTOMY     age 52    Home Medications:  Allergies as of 01/19/2021   No Known Allergies      Medication List        Accurate as of January 19, 2021 10:15 AM. If you have any questions, ask your nurse or doctor.          acetic acid-hydrocortisone OTIC solution Commonly known as: VOSOL-HC Place 3 drops into both ears 3 (three) times daily.   GLUCOSAMINE HCL PO Take by mouth.   hydrocortisone 2.5 % ointment   Multi-Vitamins Tabs Take by mouth.   omeprazole 40 MG capsule Commonly known as: PRILOSEC Take 1 capsule (40 mg total) by mouth daily.   rosuvastatin 5 MG  tablet Commonly known as: CRESTOR Take 1 tablet (5 mg total) by mouth at bedtime.   silodosin 8 MG Caps capsule Commonly known as: RAPAFLO Take 1 capsule (8 mg total) by mouth daily with breakfast.   Vitamin D3 25 MCG (1000 UT) Caps Take 5,000 Units by mouth.   vitamin E 180 MG (400 UNITS) capsule Take by mouth.        Allergies: No Known Allergies  Family History: Family History  Problem Relation Age of Onset   Dementia Mother    Cancer Father        throat/stomach   Dementia Maternal Aunt    Cancer Paternal Grandmother    Cancer Paternal Grandfather    Dementia Maternal Uncle     Social History:  reports that he has never smoked. His smokeless tobacco use includes chew. He reports current alcohol use. He reports that he does not use drugs.   Physical Exam: BP 108/65   Pulse (!) 59   Ht 6' (1.829 m)   Wt 195 lb (88.5 kg)   BMI 26.45 kg/m   Constitutional:  Alert and oriented, No acute distress. HEENT: [place] AT, moist mucus membranes.  Trachea midline, no masses. Cardiovascular: No clubbing, cyanosis, or edema. Respiratory: Normal respiratory effort, no increased work of breathing. GI: Abdomen is soft, nontender, nondistended, no abdominal masses GU: stable  large left spermatocele; wants to go to every other year DRE Skin: No rashes, bruises or suspicious lesions. Neurologic: Grossly intact, no focal deficits, moving all 4 extremities. Psychiatric: Normal mood and affect.   Assessment & Plan:    1.  BPH with LUTS Stable on silodosin, refill sent to pharmacy  2.  History elevated PSA Stable PSA at 4.0  3.  Left spermatocele Stable   Return in about 1 year (around 01/19/2022) for 1year follow up PSA prior.   Abbie Sons, [place] [address] [place], [place] [address] [phone]

## 2021-05-14 ENCOUNTER — Other Ambulatory Visit: Payer: Self-pay | Admitting: Family Medicine

## 2021-05-14 DIAGNOSIS — E7800 Pure hypercholesterolemia, unspecified: Secondary | ICD-10-CM

## 2021-05-14 NOTE — Telephone Encounter (Signed)
Patient will need an office visit for further refills. Courtesy refill. Requested Prescriptions  Pending Prescriptions Disp Refills  . rosuvastatin (CRESTOR) 5 MG tablet [Pharmacy Med Name: ROSUVASTATIN TABS 5MG ] 30 tablet 0    Sig: TAKE 1 TABLET AT BEDTIME     Cardiovascular:  Antilipid - Statins Passed - 05/14/2021  9:57 AM      Passed - Total Cholesterol in normal range and within 360 days    Cholesterol, Total  Date Value Ref Range Status  10/18/2015 205 (H) 100 - 199 mg/dL Final   Cholesterol  Date Value Ref Range Status  07/04/2020 139 <200 mg/dL Final         Passed - LDL in normal range and within 360 days    LDL Cholesterol (Calc)  Date Value Ref Range Status  07/04/2020 66 mg/dL (calc) Final    Comment:    Reference range: <100 . Desirable range <100 mg/dL for primary prevention;   <70 mg/dL for patients with CHD or diabetic patients  with > or = 2 CHD risk factors. Marland Kitchen LDL-C is now calculated using the Martin-Hopkins  calculation, which is a validated novel method providing  better accuracy than the Friedewald equation in the  estimation of LDL-C.  Cresenciano Genre et al. Annamaria Helling. 2671;245(80): 2061-2068  (http://education.[facility].com/faq/FAQ164)          Passed - HDL in normal range and within 360 days    HDL  Date Value Ref Range Status  07/04/2020 60 > OR = 40 mg/dL Final  10/18/2015 80 >39 mg/dL Final         Passed - Triglycerides in normal range and within 360 days    Triglycerides  Date Value Ref Range Status  07/04/2020 51 <150 mg/dL Final         Passed - Patient is not pregnant      Passed - Valid encounter within last 12 months    Recent Outpatient Visits          1 year ago Primary osteoarthritis involving multiple joints   [place], DO   1 year ago Right hamstring injury, initial encounter   [place], DO   2 years ago Chronic pain of right knee    [place], DO   2 years ago Primary osteoarthritis involving multiple joints   [place], DO   2 years ago Primary osteoarthritis involving multiple joints   [place], DO      Future Appointments            In 6 months  [facility], [place]   In 8 months [place], Ronda Fairly, [facility]

## 2021-06-08 ENCOUNTER — Other Ambulatory Visit: Payer: Self-pay | Admitting: Family Medicine

## 2021-06-08 DIAGNOSIS — K2100 Gastro-esophageal reflux disease with esophagitis, without bleeding: Secondary | ICD-10-CM

## 2021-06-08 DIAGNOSIS — E7800 Pure hypercholesterolemia, unspecified: Secondary | ICD-10-CM

## 2021-06-08 NOTE — Telephone Encounter (Signed)
Requested Prescriptions  Pending Prescriptions Disp Refills   rosuvastatin (CRESTOR) 5 MG tablet [Pharmacy Med Name: ROSUVASTATIN TABS 5MG ] 30 tablet 11    Sig: TAKE 1 TABLET AT BEDTIME (WILL NEED AN OFFICE VISIT FOR FURTHER REFILLS, COURTESY REFILL)     Cardiovascular:  Antilipid - Statins Passed - 06/08/2021 11:57 AM      Passed - Total Cholesterol in normal range and within 360 days    Cholesterol, Total  Date Value Ref Range Status  10/18/2015 205 (H) 100 - 199 mg/dL Final   Cholesterol  Date Value Ref Range Status  07/04/2020 139 <200 mg/dL Final         Passed - LDL in normal range and within 360 days    LDL Cholesterol (Calc)  Date Value Ref Range Status  07/04/2020 66 mg/dL (calc) Final    Comment:    Reference range: <100 . Desirable range <100 mg/dL for primary prevention;   <70 mg/dL for patients with CHD or diabetic patients  with > or = 2 CHD risk factors. Marland Kitchen LDL-C is now calculated using the Martin-Hopkins  calculation, which is a validated novel method providing  better accuracy than the Friedewald equation in the  estimation of LDL-C.  Cresenciano Genre et al. Annamaria Helling. 1700;174(94): 2061-2068  (http://education.[facility].com/faq/FAQ164)          Passed - HDL in normal range and within 360 days    HDL  Date Value Ref Range Status  07/04/2020 60 > OR = 40 mg/dL Final  10/18/2015 80 >39 mg/dL Final         Passed - Triglycerides in normal range and within 360 days    Triglycerides  Date Value Ref Range Status  07/04/2020 51 <150 mg/dL Final         Passed - Patient is not pregnant      Passed - Valid encounter within last 12 months    Recent Outpatient Visits          1 year ago Primary osteoarthritis involving multiple joints   [place], DO   1 year ago Right hamstring injury, initial encounter   [place], DO   2 years ago Chronic pain of right knee   [place], DO   2 years ago Primary osteoarthritis involving multiple joints   [place], DO   2 years ago Primary osteoarthritis involving multiple joints   [place], DO      Future Appointments            In 5 months  [facility], [place]   In 7 months [place], Ronda Fairly, MD [facility]            omeprazole ([place]) 40 MG capsule [Pharmacy Med Name: OMEPRAZOLE DR CAPS 40MG ] 90 capsule 3    Sig: TAKE 1 CAPSULE DAILY     Gastroenterology: Proton Pump Inhibitors Passed - 06/08/2021 11:57 AM      Passed - Valid encounter within last 12 months    Recent Outpatient Visits          1 year ago Primary osteoarthritis involving multiple joints   [place], DO   1 year ago Right hamstring injury, initial encounter   [place], DO   2 years ago Chronic pain of  right knee   [place], DO   2 years ago Primary osteoarthritis involving multiple joints   [place], DO   2 years ago Primary osteoarthritis involving multiple joints   [place], DO      Future Appointments            In 5 months  [facility], [place]   In 7 months [place], Ronda Fairly, [facility]

## 2021-11-13 ENCOUNTER — Ambulatory Visit: Payer: Medicare Other

## 2021-12-03 ENCOUNTER — Encounter: Payer: Self-pay | Admitting: Family Medicine

## 2021-12-03 ENCOUNTER — Ambulatory Visit
Admission: RE | Admit: 2021-12-03 | Discharge: 2021-12-03 | Disposition: A | Discharge status: Home / Self Care | Payer: Medicare Other | Source: Ambulatory Visit | Attending: Family Medicine | Admitting: Family Medicine

## 2021-12-03 ENCOUNTER — Ambulatory Visit (INDEPENDENT_AMBULATORY_CARE_PROVIDER_SITE_OTHER): Payer: Medicare Other | Admitting: Family Medicine

## 2021-12-03 VITALS — BP 117/80 | HR 59 | Ht 72.0 in | Wt 202.4 lb

## 2021-12-03 DIAGNOSIS — M7989 Other specified soft tissue disorders: Secondary | ICD-10-CM | POA: Diagnosis not present

## 2021-12-03 DIAGNOSIS — M79662 Pain in left lower leg: Secondary | ICD-10-CM

## 2021-12-03 DIAGNOSIS — R252 Cramp and spasm: Secondary | ICD-10-CM

## 2021-12-03 MED ORDER — BACLOFEN 10 MG PO TABS: 5.0000 mg | ORAL_TABLET | Freq: Three times a day (TID) | ORAL | 1 refills | Status: DC | PRN

## 2021-12-04 LAB — COMPLETE METABOLIC PANEL WITH GFR
AG Ratio: 1.5 (calc) (ref 1.0–2.5)
ALT: 39 U/L (ref 9–46)
AST: 28 U/L (ref 10–35)
Albumin: 4.2 g/dL (ref 3.6–5.1)
Alkaline phosphatase (APISO): 49 U/L (ref 35–144)
BUN: 15 mg/dL (ref 7–25)
CO2: 26 mmol/L (ref 20–32)
Calcium: 9.4 mg/dL (ref 8.6–10.3)
Chloride: 107 mmol/L (ref 98–110)
Creat: 1.03 mg/dL (ref 0.70–1.28)
Globulin: 2.8 g/dL (calc) (ref 1.9–3.7)
Glucose, Bld: 96 mg/dL (ref 65–139)
Potassium: 4.8 mmol/L (ref 3.5–5.3)
Sodium: 139 mmol/L (ref 135–146)
Total Bilirubin: 0.8 mg/dL (ref 0.2–1.2)
Total Protein: 7 g/dL (ref 6.1–8.1)
eGFR: 77 mL/min/{1.73_m2} (ref >=60)

## 2021-12-04 LAB — CBC WITH DIFFERENTIAL/PLATELET
Absolute Monocytes: 400 cells/uL (ref 200–950)
Basophils Absolute: 51 cells/uL (ref 0–200)
Basophils Relative: 1.1 %
Eosinophils Absolute: 460 cells/uL (ref 15–500)
Eosinophils Relative: 10 %
HCT: 44.6 % (ref 38.5–50.0)
Hemoglobin: 14.9 g/dL (ref 13.2–17.1)
Lymphs Abs: 1716 cells/uL (ref 850–3900)
MCH: 31.2 pg (ref 27.0–33.0)
MCHC: 33.4 g/dL (ref 32.0–36.0)
MCV: 93.3 fL (ref 80.0–100.0)
MPV: 10 fL (ref 7.5–12.5)
Monocytes Relative: 8.7 %
Neutro Abs: 1973 cells/uL (ref 1500–7800)
Neutrophils Relative %: 42.9 %
Platelets: 251 10*3/uL (ref 140–400)
RBC: 4.78 10*6/uL (ref 4.20–5.80)
RDW: 11.9 % (ref 11.0–15.0)
Total Lymphocyte: 37.3 %
WBC: 4.6 10*3/uL (ref 3.8–10.8)

## 2021-12-04 LAB — MAGNESIUM: Magnesium: 2.4 mg/dL (ref 1.5–2.5)

## 2021-12-20 ENCOUNTER — Encounter: Payer: Self-pay | Admitting: Family Medicine

## 2021-12-20 DIAGNOSIS — E7800 Pure hypercholesterolemia, unspecified: Secondary | ICD-10-CM

## 2021-12-20 MED ORDER — ROSUVASTATIN CALCIUM 5 MG PO TABS: 5.0000 mg | ORAL_TABLET | ORAL | 3 refills | Status: DC

## 2022-01-03 DIAGNOSIS — Z461 Encounter for fitting and adjustment of hearing aid: Secondary | ICD-10-CM | POA: Insufficient documentation

## 2022-01-04 ENCOUNTER — Ambulatory Visit (INDEPENDENT_AMBULATORY_CARE_PROVIDER_SITE_OTHER): Payer: Medicare Other

## 2022-01-04 VITALS — Wt 202.0 lb

## 2022-01-04 DIAGNOSIS — Z0000 Encounter for general adult medical examination without abnormal findings: Secondary | ICD-10-CM

## 2022-01-04 NOTE — Progress Notes (Signed)
Virtual Visit via Telephone Note  I connected with  Rodney Cummings on 01/04/22 at  1:30 PM EDT by telephone and verified that I am speaking with the correct person using two identifiers.  Location: Patient: home Provider: [facility] Persons participating in the virtual visit: [place]   I discussed the limitations, risks, security and privacy concerns of performing an evaluation and management service by telephone and the availability of in person appointments. The patient expressed understanding and agreed to proceed.  Interactive audio and video telecommunications were attempted between this nurse and patient, however failed, due to patient having technical difficulties OR patient did not have access to video capability.  We continued and completed visit with audio only.  Some vital signs may be absent or patient reported.   Dionisio David, LPN  Subjective:   Rodney Cummings is a 71 y.o. male who presents for Medicare Annual/Subsequent preventive examination.  Review of Systems           Objective:    There were no vitals filed for this visit. There is no height or weight on file to calculate BMI.     11/07/2020    1:24 PM 11/02/2019    1:27 PM  Advanced Directives  Does Patient Have a Medical Advance Directive? Yes Yes  Type of Paramedic of [place];Living will Living will;Healthcare Power of [place] in Chart? No - copy requested No - copy requested    Current Medications (verified) Outpatient Encounter Medications as of 01/04/2022  Medication Sig   acetic acid-hydrocortisone (VOSOL-HC) OTIC solution Place 3 drops into both ears 3 (three) times daily.   Cholecalciferol (VITAMIN D3) 1000 units CAPS Take 5,000 Units by mouth.    GLUCOSAMINE HCL PO Take by mouth.   Multiple Vitamin (MULTI-VITAMINS) TABS Take by mouth.   omeprazole (PRILOSEC) 40 MG capsule TAKE 1 CAPSULE DAILY   rosuvastatin  (CRESTOR) 5 MG tablet Take 1 tablet (5 mg total) by mouth every other day.   silodosin (RAPAFLO) 8 MG CAPS capsule Take 1 capsule (8 mg total) by mouth daily with breakfast.   vitamin E 180 MG (400 UNITS) capsule Take by mouth.   baclofen (LIORESAL) 10 MG tablet Take 0.5-1 tablets (5-10 mg total) by mouth 3 (three) times daily as needed for muscle spasms. (Patient not taking: Reported on 01/04/2022)   [DISCONTINUED] hydrocortisone 2.5 % ointment    No facility-administered encounter medications on file as of 01/04/2022.    Allergies (verified) Patient has no known allergies.   History: Past Medical History:  Diagnosis Date   Elevated PSA    GERD (gastroesophageal reflux disease)    Hearing loss    Past Surgical History:  Procedure Laterality Date   SQUAMOUS CELL CARCINOMA EXCISION Right 2020   arm    TONSILLECTOMY     age 52   Family History  Problem Relation Age of Onset   Dementia Mother    Cancer Father        throat/stomach   Dementia Maternal Aunt    Cancer Paternal Grandmother    Cancer Paternal Grandfather    Dementia Maternal Uncle    Social History   Socioeconomic History   Marital status: Married    Spouse name: Not on file   Number of children: Not on file   Years of education: Not on file   Highest education level: Not on file  Occupational History   Occupation: retired  Tobacco Use   Smoking status: Never   Smokeless tobacco: Current    Types: Chew  Vaping Use   Vaping Use: Never used  Substance and Sexual Activity   Alcohol use: Yes    Alcohol/week: 0.0 standard drinks of alcohol    Comment: "beer on weekends"    Drug use: No   Sexual activity: Yes  Other Topics Concern   Not on file  Social History Narrative   Not on file   Social Determinants of Health   Financial Resource Strain: Low Risk  (11/07/2020)   Overall Financial Resource Strain (CARDIA)    Difficulty of Paying Living Expenses: Not hard at all  Food Insecurity: No Food  Insecurity (11/07/2020)   Hunger Vital Sign    Worried About Running Out of Food in the Last Year: Never true    [place] in the Last Year: Never true  Transportation Needs: No Transportation Needs (11/07/2020)   PRAPARE - Hydrologist (Medical): No    Lack of Transportation (Non-Medical): No  Physical Activity: Sufficiently Active (01/04/2022)   Exercise Vital Sign    Days of Exercise per Week: 3 days    Minutes of Exercise per Session: 60 min  Stress: No Stress Concern Present (01/04/2022)   [place]    Feeling of Stress : Not at all  Social Connections: [place] (11/02/2019)   Social Connection and Isolation Panel [NHANES]    Frequency of Communication with Friends and Family: More than three times a week    Frequency of Social Gatherings with Friends and Family: More than three times a week    Attends Religious Services: More than 4 times per year    Active Member of [employer] or Organizations: Yes    Attends Music therapist: More than 4 times per year    Marital Status: Married    Tobacco Counseling Ready to quit: Not Answered Counseling given: Not Answered   Clinical Intake:  Pre-visit preparation completed: Yes  Pain : No/denies pain     Nutritional Risks: None Diabetes: No  How often do you need to have someone help you when you read instructions, pamphlets, or other written materials from your doctor or pharmacy?: 1 - Never  Diabetic?no  Interpreter Needed?: No  Information entered by :: Kirke Shaggy, LPN   Activities of Daily Living     No data to display          Patient Care Team: Olin Hauser, DO as PCP - General (Family Medicine)  Indicate any recent Medical Services you may have received from other than [facility] providers in the past year (date may be approximate).     Assessment:   This is a routine wellness  examination for Rodney Cummings.  Hearing/Vision screen No results found.  Dietary issues and exercise activities discussed:     Goals Addressed             This Visit's Progress    DIET - EAT MORE FRUITS AND VEGETABLES         Depression Screen    01/04/2022    1:28 PM 12/03/2021    9:32 AM 11/07/2020    1:25 PM 03/16/2020    9:16 AM 11/02/2019    1:27 PM 04/27/2019    9:32 AM 03/16/2019    9:17 AM  PHQ 2/9 Scores  PHQ - 2 Score 1 3 0 0 0 0  0  PHQ- 9 Score 3 7    0     Fall Risk    12/03/2021    9:32 AM 11/07/2020    1:25 PM 03/16/2020    9:16 AM 11/02/2019    1:29 PM 03/16/2019    9:19 AM  Fall Risk   Falls in the past year? 0 0 0 0 0  Number falls in past yr: 0  0 0 0  Injury with Fall? 0  0 0   Risk for fall due to : No Fall Risks Medication side effect     Follow up Falls evaluation completed Falls evaluation completed;Education provided;Falls prevention discussed Falls evaluation completed      FALL RISK PREVENTION PERTAINING TO THE HOME:  Any stairs in or around the home? No  If so, are there any without handrails? No  Home free of loose throw rugs in walkways, pet beds, electrical cords, etc? Yes  Adequate lighting in your home to reduce risk of falls? Yes   ASSISTIVE DEVICES UTILIZED TO PREVENT FALLS:  Life alert? No  Use of a cane, walker or w/c? No  Grab bars in the bathroom? No  Shower chair or bench in shower? No  Elevated toilet seat or a handicapped toilet? No    Cognitive Function:declined        11/07/2020    1:28 PM  6CIT Screen  What Year? 0 points  What month? 0 points  What time? 0 points  Count back from 20 0 points  Months in reverse 4 points  Repeat phrase 2 points  Total Score 6 points    Immunizations Immunization History  Administered Date(s) Administered   Fluad Quad(high Dose 65+) 03/16/2019, 03/16/2020   Influenza, High Dose Seasonal PF 04/23/2016, 04/17/2017, 03/18/2018   PFIZER(Purple Top)SARS-COV-2 Vaccination  07/12/2019, 07/18/2019, 08/01/2019, 08/08/2019   Pneumococcal Conjugate-13 10/17/2015   Tdap 10/17/2015    TDAP status: Up to date  Flu Vaccine status: Declined, Education has been provided regarding the importance of this vaccine but patient still declined. Advised may receive this vaccine at local pharmacy or Health Dept. Aware to provide a copy of the vaccination record if obtained from local pharmacy or Health Dept. Verbalized acceptance and understanding.  Pneumococcal vaccine status: Due, Education has been provided regarding the importance of this vaccine. Advised may receive this vaccine at local pharmacy or Health Dept. Aware to provide a copy of the vaccination record if obtained from local pharmacy or Health Dept. Verbalized acceptance and understanding.  Covid-19 vaccine status: Completed vaccines  Qualifies for Shingles Vaccine? Yes   Zostavax completed No   Shingrix Completed?: No.    Education has been provided regarding the importance of this vaccine. Patient has been advised to call insurance company to determine out of pocket expense if they have not yet received this vaccine. Advised may also receive vaccine at local pharmacy or Health Dept. Verbalized acceptance and understanding.  Screening Tests Health Maintenance  Topic Date Due   Zoster Vaccines- Shingrix (1 of 2) Never done   Pneumonia Vaccine 80+ Years old (2 - PPSV23 or PCV20) 10/16/2016   COVID-19 Vaccine (5 - Booster for Pfizer series) 10/03/2019   INFLUENZA VACCINE  01/08/2022   COLONOSCOPY (Pts 45-13yr Insurance coverage will need to be confirmed)  10/28/2023   TETANUS/TDAP  10/16/2025   Hepatitis C Screening  Completed   HPV VACCINES  Aged Out    Health Maintenance  Health Maintenance Due  Topic Date Due   Zoster  Vaccines- Shingrix (1 of 2) Never done   Pneumonia Vaccine 49+ Years old (2 - PPSV23 or PCV20) 10/16/2016   COVID-19 Vaccine (5 - Booster for Pfizer series) 10/03/2019    Colorectal  cancer screening: Type of screening: Colonoscopy. Completed 10/27/13. Repeat every 10 years  Lung Cancer Screening: (Low Dose CT Chest recommended if Age 7-80 years, 30 pack-year currently smoking OR have quit w/in 15years.) does not qualify.     Additional Screening:  Hepatitis C Screening: does qualify; Completed 01/15/17  Vision Screening: Recommended annual ophthalmology exams for early detection of glaucoma and other disorders of the eye. Is the patient up to date with their annual eye exam?  No  Who is the provider or what is the name of the office in which the patient attends annual eye exams? No one If pt is not established with a provider, would they like to be referred to a provider to establish care? No .   Dental Screening: Recommended annual dental exams for proper oral hygiene  Community Resource Referral / Chronic Care Management: CRR required this visit?  No   CCM required this visit?  No      Plan:     I have personally reviewed and noted the following in the patient's chart:   Medical and social history Use of alcohol, tobacco or illicit drugs  Current medications and supplements including opioid prescriptions. Patient is not currently taking opioid prescriptions. Functional ability and status Nutritional status Physical activity Advanced directives List of other physicians Hospitalizations, surgeries, and ER visits in previous 12 months Vitals Screenings to include cognitive, depression, and falls Referrals and appointments  In addition, I have reviewed and discussed with patient certain preventive protocols, quality metrics, and best practice recommendations. A written personalized care plan for preventive services as well as general preventive health recommendations were provided to patient.     Dionisio David, LPN   [id number]/[id number]   Nurse Notes: no

## 2022-01-04 NOTE — Patient Instructions (Signed)
[name] , Thank you for taking time to come for your Medicare Wellness Visit. I appreciate your ongoing commitment to your health goals. Please review the following plan we discussed and let me know if I can assist you in the future.   Screening recommendations/referrals: Colonoscopy: 10/27/13 Recommended yearly ophthalmology/optometry visit for glaucoma screening and checkup Recommended yearly dental visit for hygiene and checkup  Vaccinations: Influenza vaccine: n/d Pneumococcal vaccine: 10/17/15 Tdap vaccine: 10/17/15 Shingles vaccine: n/d   Covid-19: 07/12/19, 07/18/19, 08/01/19, 08/08/19 (??)  Advanced directives: no  Conditions/risks identified: no  Next appointment: Follow up in one year for your annual wellness visit. 01/10/23 @ 1:30 pm by phone  Preventive Care 65 Years and Older, Male Preventive care refers to lifestyle choices and visits with your health care provider that can promote health and wellness. What does preventive care include? A yearly physical exam. This is also called an annual well check. Dental exams once or twice a year. Routine eye exams. Ask your health care provider how often you should have your eyes checked. Personal lifestyle choices, including: Daily care of your teeth and gums. Regular physical activity. Eating a healthy diet. Avoiding tobacco and drug use. Limiting alcohol use. Practicing safe sex. Taking low doses of aspirin every day. Taking vitamin and mineral supplements as recommended by your health care provider. What happens during an annual well check? The services and screenings done by your health care provider during your annual well check will depend on your age, overall health, lifestyle risk factors, and family history of disease. Counseling  Your health care provider may ask you questions about your: Alcohol use. Tobacco use. Drug use. Emotional well-being. Home and relationship well-being. Sexual activity. Eating habits. History  of falls. Memory and ability to understand (cognition). Work and work Statistician. Screening  You may have the following tests or measurements: Height, weight, and BMI. Blood pressure. Lipid and cholesterol levels. These may be checked every 5 years, or more frequently if you are over 72 years old. Skin check. Lung cancer screening. You may have this screening every year starting at age 72 if you have a 30-pack-year history of smoking and currently smoke or have quit within the past 15 years. Fecal occult blood test (FOBT) of the stool. You may have this test every year starting at age 72. Flexible sigmoidoscopy or colonoscopy. You may have a sigmoidoscopy every 5 years or a colonoscopy every 10 years starting at age 72. Prostate cancer screening. Recommendations will vary depending on your family history and other risks. Hepatitis C blood test. Hepatitis B blood test. Sexually transmitted disease (STD) testing. Diabetes screening. This is done by checking your blood sugar (glucose) after you have not eaten for a while (fasting). You may have this done every 1-3 years. Abdominal aortic aneurysm (AAA) screening. You may need this if you are a current or former smoker. Osteoporosis. You may be screened starting at age 30 if you are at high risk. Talk with your health care provider about your test results, treatment options, and if necessary, the need for more tests. Vaccines  Your health care provider may recommend certain vaccines, such as: Influenza vaccine. This is recommended every year. Tetanus, diphtheria, and acellular pertussis (Tdap, Td) vaccine. You may need a Td booster every 10 years. Zoster vaccine. You may need this after age 72. Pneumococcal 13-valent conjugate (PCV13) vaccine. One dose is recommended after age 72. Pneumococcal polysaccharide (PPSV23) vaccine. One dose is recommended after age 71. Talk to your  health care provider about which screenings and vaccines you need  and how often you need them. This information is not intended to replace advice given to you by your health care provider. Make sure you discuss any questions you have with your health care provider. Document Released: 06/23/2015 Document Revised: 02/14/2016 Document Reviewed: 03/28/2015 Elsevier Interactive Patient Education  2017 [place] Prevention in the Home Falls can cause injuries. They can happen to people of all ages. There are many things you can do to make your home safe and to help prevent falls. What can I do on the outside of my home? Regularly fix the edges of walkways and driveways and fix any cracks. Remove anything that might make you trip as you walk through a door, such as a raised step or threshold. Trim any bushes or trees on the path to your home. Use bright outdoor lighting. Clear any walking paths of anything that might make someone trip, such as rocks or tools. Regularly check to see if handrails are loose or broken. Make sure that both sides of any steps have handrails. Any raised decks and porches should have guardrails on the edges. Have any leaves, snow, or ice cleared regularly. Use sand or salt on walking paths during winter. Clean up any spills in your garage right away. This includes oil or grease spills. What can I do in the bathroom? Use night lights. Install grab bars by the toilet and in the tub and shower. Do not use towel bars as grab bars. Use non-skid mats or decals in the tub or shower. If you need to sit down in the shower, use a plastic, non-slip stool. Keep the floor dry. Clean up any water that spills on the floor as soon as it happens. Remove soap buildup in the tub or shower regularly. Attach bath mats securely with double-sided non-slip rug tape. Do not have throw rugs and other things on the floor that can make you trip. What can I do in the bedroom? Use night lights. Make sure that you have a light by your bed that is easy  to reach. Do not use any sheets or blankets that are too big for your bed. They should not hang down onto the floor. Have a firm chair that has side arms. You can use this for support while you get dressed. Do not have throw rugs and other things on the floor that can make you trip. What can I do in the kitchen? Clean up any spills right away. Avoid walking on wet floors. Keep items that you use a lot in easy-to-reach places. If you need to reach something above you, use a strong step stool that has a grab bar. Keep electrical cords out of the way. Do not use floor polish or wax that makes floors slippery. If you must use wax, use non-skid floor wax. Do not have throw rugs and other things on the floor that can make you trip. What can I do with my stairs? Do not leave any items on the stairs. Make sure that there are handrails on both sides of the stairs and use them. Fix handrails that are broken or loose. Make sure that handrails are as long as the stairways. Check any carpeting to make sure that it is firmly attached to the stairs. Fix any carpet that is loose or worn. Avoid having throw rugs at the top or bottom of the stairs. If you do have throw rugs, attach them  to the floor with carpet tape. Make sure that you have a light switch at the top of the stairs and the bottom of the stairs. If you do not have them, ask someone to add them for you. What else can I do to help prevent falls? Wear shoes that: Do not have high heels. Have rubber bottoms. Are comfortable and fit you well. Are closed at the toe. Do not wear sandals. If you use a stepladder: Make sure that it is fully opened. Do not climb a closed stepladder. Make sure that both sides of the stepladder are locked into place. Ask someone to hold it for you, if possible. Clearly mark and make sure that you can see: Any grab bars or handrails. First and last steps. Where the edge of each step is. Use tools that help you move  around (mobility aids) if they are needed. These include: Canes. Walkers. Scooters. Crutches. Turn on the lights when you go into a dark area. Replace any light bulbs as soon as they burn out. Set up your furniture so you have a clear path. Avoid moving your furniture around. If any of your floors are uneven, fix them. If there are any pets around you, be aware of where they are. Review your medicines with your doctor. Some medicines can make you feel dizzy. This can increase your chance of falling. Ask your doctor what other things that you can do to help prevent falls. This information is not intended to replace advice given to you by your health care provider. Make sure you discuss any questions you have with your health care provider. Document Released: 03/23/2009 Document Revised: 11/02/2015 Document Reviewed: 07/01/2014 Elsevier Interactive Patient Education  2017 [employer].

## 2022-01-15 ENCOUNTER — Other Ambulatory Visit: Payer: Medicare Other

## 2022-01-15 DIAGNOSIS — R350 Frequency of micturition: Secondary | ICD-10-CM | POA: Diagnosis not present

## 2022-01-15 DIAGNOSIS — N401 Enlarged prostate with lower urinary tract symptoms: Secondary | ICD-10-CM | POA: Diagnosis not present

## 2022-01-16 LAB — PSA: Prostate Specific Ag, Serum: 4.9 ng/mL — ABNORMAL HIGH (ref 0.0–4.0)

## 2022-01-17 ENCOUNTER — Other Ambulatory Visit: Payer: Self-pay | Admitting: Family Medicine

## 2022-01-17 DIAGNOSIS — K2100 Gastro-esophageal reflux disease with esophagitis, without bleeding: Secondary | ICD-10-CM

## 2022-01-17 NOTE — Telephone Encounter (Signed)
Requested Prescriptions  Pending Prescriptions Disp Refills  . omeprazole (PRILOSEC) 40 MG capsule [Pharmacy Med Name: OMEPRAZOLE DR CAPS '40MG'$ ] 90 capsule 1    Sig: TAKE 1 CAPSULE DAILY     Gastroenterology: Proton Pump Inhibitors Passed - 01/17/2022  7:06 AM      Passed - Valid encounter within last 12 months    Recent Outpatient Visits          1 month ago Pain and swelling of left lower leg   [place], DO   1 year ago Primary osteoarthritis involving multiple joints   [place], DO   1 year ago Right hamstring injury, initial encounter   [place], DO   2 years ago Chronic pain of right knee   [place], DO   2 years ago Primary osteoarthritis involving multiple joints   [place], Devonne Doughty, DO      Future Appointments            Tomorrow Bernardo Heater, Ronda Fairly, [facility]

## 2022-01-18 ENCOUNTER — Encounter: Payer: Self-pay | Admitting: Urology

## 2022-01-18 ENCOUNTER — Other Ambulatory Visit: Payer: Self-pay

## 2022-01-18 ENCOUNTER — Ambulatory Visit (INDEPENDENT_AMBULATORY_CARE_PROVIDER_SITE_OTHER): Payer: Medicare Other | Admitting: Urology

## 2022-01-18 VITALS — BP 112/72 | HR 58 | Ht 72.0 in | Wt 192.0 lb

## 2022-01-18 DIAGNOSIS — R350 Frequency of micturition: Secondary | ICD-10-CM | POA: Diagnosis not present

## 2022-01-18 DIAGNOSIS — R6882 Decreased libido: Secondary | ICD-10-CM

## 2022-01-18 DIAGNOSIS — R972 Elevated prostate specific antigen [PSA]: Secondary | ICD-10-CM

## 2022-01-18 DIAGNOSIS — N401 Enlarged prostate with lower urinary tract symptoms: Secondary | ICD-10-CM | POA: Diagnosis not present

## 2022-01-18 MED ORDER — SILODOSIN 8 MG PO CAPS: 8.0000 mg | ORAL_CAPSULE | Freq: Every day | ORAL | 3 refills | Status: DC

## 2022-01-18 NOTE — Progress Notes (Signed)
01/18/2022 9:48 AM   Rodney Cummings December 24, 1950 [id number]  Referring provider: Olin Hauser, DO [address] [place],  [place] [address]  Chief Complaint  Patient presents with   Elevated PSA    Urologic history: 1.  Elevated PSA Benign Prostate Biopsy (~2007) following mid 4 range PSA  PSA up to 6.2 with MRI revealing PI-RADS 3/PI-RADS 4 lesions with 56-gram prostate volume.  Fusion biopsy (12/30/2017) at [facility]. Pathology: Standard 12 core biopsy and targeted lesions all showed benign prostate tissue.  One core from the targeted biopsy did show focal chronic inflammatory change. Last PSA (07/07/2018) was 3.7   2.  BPH with lower urinary tract symptoms Currently on silodosin   3.  History of stone disease s/p endoscopic stone removal in 1986  4.  Left spermatocele   HPI: 71 y.o. male presents for annual follow-up.  Has done well since last years visit and has no complaints Stable LUTS on silodosin.  Did run out of medication for approximately 1 week and noted worsening symptoms.  He had been off meds at the time of PSA draw Denies dysuria, gross hematuria No flank, abdominal or pelvic pain PSA 01/15/2022 slightly increased at 4.9 Noted spontaneous resolution of his left spermatocele in July New complaint today of significantly decreased libido   PMH: Past Medical History:  Diagnosis Date   Elevated PSA    GERD (gastroesophageal reflux disease)    Hearing loss     Surgical History: Past Surgical History:  Procedure Laterality Date   SQUAMOUS CELL CARCINOMA EXCISION Right 2020   arm    TONSILLECTOMY     age 52    Home Medications:  Allergies as of 01/18/2022   No Known Allergies      Medication List        Accurate as of January 18, 2022  9:48 AM. If you have any questions, ask your nurse or doctor.          STOP taking these medications    baclofen 10 MG tablet Commonly known as: LIORESAL Stopped by: Abbie Sons, MD   GLUCOSAMINE  HCL PO Stopped by: Abbie Sons, MD       TAKE these medications    acetic acid-hydrocortisone OTIC solution Commonly known as: VOSOL-HC Place 3 drops into both ears 3 (three) times daily.   Multi-Vitamins Tabs Take by mouth.   omeprazole 40 MG capsule Commonly known as: PRILOSEC TAKE 1 CAPSULE DAILY   rosuvastatin 5 MG tablet Commonly known as: CRESTOR Take 1 tablet (5 mg total) by mouth every other day.   silodosin 8 MG Caps capsule Commonly known as: RAPAFLO Take 1 capsule (8 mg total) by mouth daily with breakfast.   Vitamin D3 25 MCG (1000 UT) Caps Take 5,000 Units by mouth.   vitamin E 180 MG (400 UNITS) capsule Take by mouth.        Allergies: No Known Allergies  Family History: Family History  Problem Relation Age of Onset   Dementia Mother    Cancer Father        throat/stomach   Dementia Maternal Aunt    Cancer Paternal Grandmother    Cancer Paternal Grandfather    Dementia Maternal Uncle     Social History:  reports that he has never smoked. His smokeless tobacco use includes chew. He reports current alcohol use. He reports that he does not use drugs.   Physical Exam: BP 112/72   Pulse (!) 58  Ht 6' (1.829 m)   Wt 192 lb (87.1 kg)   BMI 26.04 kg/m   Constitutional:  Alert and oriented, No acute distress. HEENT: [place] AT Respiratory: Normal respiratory effort, no increased work of breathing. GU: Resolution of left spermatocele Skin: No rashes, bruises or suspicious lesions. Neurologic: Grossly intact, no focal deficits, moving all 4 extremities. Psychiatric: Normal mood and affect.   Assessment & Plan:    1.  BPH with LUTS Stable on silodosin, refill sent to pharmacy  2.  Elevated PSA PSA bump 4.9.  He had been off silodosin x 1 week and may be 2/2 transient inflammation.  Repeat lab visit in 6 months with PSA Continue annual follow-up.  Due for DRE next year  3.  Left spermatocele Resolved  4.  Low libido Check  testosterone/LH    Abbie Sons, MD  [facility] [address] [place], [place] [address] [phone]

## 2022-01-19 LAB — TESTOSTERONE: Testosterone: 255 ng/dL — ABNORMAL LOW (ref 264–916)

## 2022-01-19 LAB — LUTEINIZING HORMONE: LH: 5.5 m[IU]/mL (ref 1.7–8.6)

## 2022-01-21 ENCOUNTER — Ambulatory Visit: Payer: Self-pay

## 2022-01-21 ENCOUNTER — Encounter: Payer: Self-pay | Admitting: Family Medicine

## 2022-01-21 ENCOUNTER — Telehealth: Payer: Self-pay | Admitting: *Deleted

## 2022-01-21 DIAGNOSIS — R6882 Decreased libido: Secondary | ICD-10-CM

## 2022-01-21 NOTE — Telephone Encounter (Signed)
Notified patient as instructed, patient pleased. Discussed follow-up appointments, patient agrees  

## 2022-01-21 NOTE — Telephone Encounter (Signed)
-----   Message from Abbie Sons, MD sent at 01/20/2022 11:22 AM EDT ----- Testosterone level was low at 255.  Schedule a repeat a.m. testosterone level and then a follow-up appointment to discuss treatment options.

## 2022-01-21 NOTE — Telephone Encounter (Signed)
  Chief Complaint: vertigo Symptoms: vertigo, feeling unsteady on feet, difficulty concentrating, cloudiness vision  Frequency: 4 days Pertinent Negatives: NA Disposition: '[]'$ ED /'[]'$ Urgent Care (no appt availability in office) / '[x]'$ Appointment(In office/virtual)/ '[]'$  [facility]/ '[]'$ Home Care/ '[]'$ Refused Recommended Disposition /'[]'$ [employer] [facility]/ '[]'$  Follow-up with PCP Additional Notes: pt has done the debrox ear gtts and flushed out and then did some exercises this morning but nothing has really helped dizziness. Scheduled pt for tomorrow at 1600 with Dr. Raliegh Ip. Advised pt if practice has any cancellations today will pass along to notify him. Wife was asking if pt needed to go to ENT and I advised since pt doesn't currently have ENT would need referral.   Reason for Disposition  [1] MODERATE dizziness (e.g., vertigo; feels very unsteady, interferes with normal activities) AND [2] has NOT been evaluated by doctor (or NP/PA) for this  Answer Assessment - Initial Assessment Questions 2. VERTIGO: "Do you feel like either you or the room is spinning or tilting?"      yes 3. LIGHTHEADED: "Do you feel lightheaded?" (e.g., somewhat faint, woozy, weak upon standing)     yes 4. SEVERITY: "How bad is it?"  "Can you walk?"   - MILD: Feels slightly dizzy and unsteady, but is walking normally.   - MODERATE: Feels unsteady when walking, but not falling; interferes with normal activities (e.g., school, work).   - SEVERE: Unable to walk without falling, or requires assistance to walk without falling.     Moderate  5. ONSET:  "When did the dizziness begin?"     4 days  8. RECURRENT SYMPTOM: "Have you had dizziness before?" If Yes, ask: "When was the last time?" "What happened that time?"     Yes, ear debrox gtts,  9. OTHER SYMPTOMS: "Do you have any other symptoms?" (e.g., headache, weakness, numbness, vomiting, earache)     Vision cloudy, difficulty concentrating  Protocols used: Dizziness -  Vertigo-A-AH

## 2022-01-22 ENCOUNTER — Encounter: Payer: Self-pay | Admitting: Family Medicine

## 2022-01-22 ENCOUNTER — Ambulatory Visit (INDEPENDENT_AMBULATORY_CARE_PROVIDER_SITE_OTHER): Payer: Medicare Other | Admitting: Family Medicine

## 2022-01-22 VITALS — BP 114/60 | HR 68 | Ht 72.0 in | Wt 200.8 lb

## 2022-01-22 DIAGNOSIS — H8113 Benign paroxysmal vertigo, bilateral: Secondary | ICD-10-CM | POA: Diagnosis not present

## 2022-01-22 NOTE — Progress Notes (Signed)
Subjective:    Patient ID: Rodney Cummings, male    DOB: March 04, 1951, 71 y.o.   MRN: [id number]  Rodney Cummings is a 71 y.o. male presenting on 01/22/2022 for Dizziness  Patient presents for a same day appointment. Here with wife  HPI  Vertigo Reports symptoms started on Friday last week. He did some Epley Manuever exercises yesterday and they were significantly helpful, now much better. Not on med Brain foggy associated with the dizzy spells He has had vertigo before Ear wax cleaning flushing was done in past he has done it at home No numbness tingling weakness, near syncope vision loss chest pain headache     01/04/2022    1:28 PM 12/03/2021    9:32 AM 11/07/2020    1:25 PM  Depression screen PHQ 2/9  Decreased Interest 1 3 0  Down, Depressed, Hopeless 0 0 0  PHQ - 2 Score 1 3 0  Altered sleeping 1 2   Tired, decreased energy 1 2   Change in appetite 0 0   Feeling bad or failure about yourself  0 0   Trouble concentrating 0 0   Moving slowly or fidgety/restless 0 0   Suicidal thoughts 0 0   PHQ-9 Score 3 7   Difficult doing work/chores Not difficult at all Somewhat difficult     Social History   Tobacco Use   Smoking status: Never   Smokeless tobacco: Current    Types: Chew  Vaping Use   Vaping Use: Never used  Substance Use Topics   Alcohol use: Yes    Alcohol/week: 0.0 standard drinks of alcohol    Comment: "beer on weekends"    Drug use: No    Review of Systems Per HPI unless specifically indicated above     Objective:    BP 114/60   Pulse 68   Ht 6' (1.829 m)   Wt 200 lb 12.8 oz (91.1 kg)   SpO2 100%   BMI 27.23 kg/m   Wt Readings from Last 3 Encounters:  01/22/22 200 lb 12.8 oz (91.1 kg)  01/18/22 192 lb (87.1 kg)  01/04/22 202 lb (91.6 kg)    Physical Exam Vitals and nursing note reviewed.  Constitutional:      General: He is not in acute distress.    Appearance: Normal appearance. He is well-developed. He is not diaphoretic.      Comments: Well-appearing, comfortable, cooperative  HENT:     Head: Normocephalic and atraumatic.     Right Ear: Ear canal and external ear normal. There is no impacted cerumen.     Left Ear: Ear canal and external ear normal. There is no impacted cerumen.     Ears:     Comments: Hearing aids in, removed for exam, some mild scarring on TMs, no effusion Eyes:     General:        Right eye: No discharge.        Left eye: No discharge.     Conjunctiva/sclera: Conjunctivae normal.  Cardiovascular:     Rate and Rhythm: Normal rate.  Pulmonary:     Effort: Pulmonary effort is normal.  Skin:    General: Skin is warm and dry.     Findings: No erythema or rash.  Neurological:     General: No focal deficit present.     Mental Status: He is alert and oriented to person, place, and time. Mental status is at baseline.     Cranial Nerves: No  cranial nerve deficit.     Sensory: No sensory deficit.     Motor: No weakness.  Psychiatric:        Mood and Affect: Mood normal.        Behavior: Behavior normal.        Thought Content: Thought content normal.     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     Follow up plan: Return if symptoms worsen or fail to improve.  Nobie Putnam, [facility] 01/22/2022, 4:40 PM

## 2022-01-22 NOTE — Patient Instructions (Addendum)
Thank you for coming to the office today.    Please schedule a Follow-up Appointment to: Return if symptoms worsen or fail to improve.  If you have any other questions or concerns, please feel free to call the office or send a message through MyChart. You may also schedule an earlier appointment if necessary.  Additionally, you may be receiving a survey about your experience at our office within a few days to 1 week by e-mail or mail. We value your feedback.  Hermes Wafer, DO [facility], [facility] 

## 2022-01-23 ENCOUNTER — Other Ambulatory Visit: Payer: Medicare Other

## 2022-01-23 DIAGNOSIS — R6882 Decreased libido: Secondary | ICD-10-CM | POA: Diagnosis not present

## 2022-01-24 LAB — TESTOSTERONE: Testosterone: 422 ng/dL (ref 264–916)

## 2022-01-28 LAB — TESTOSTERONE FREE, PROFILE I
Albumin: 4.3 g/dL (ref 3.8–4.8)
Sex Hormone Binding: 50.6 nmol/L (ref 19.3–76.4)
Testost., Free, Calc: 73.6 pg/mL (ref 31.7–120.8)
Testosterone: 469 ng/dL (ref 264–916)

## 2022-01-28 LAB — SPECIMEN STATUS REPORT

## 2022-01-29 ENCOUNTER — Encounter: Payer: Self-pay | Admitting: *Deleted

## 2022-02-01 ENCOUNTER — Encounter: Payer: Self-pay | Admitting: Urology

## 2022-02-01 ENCOUNTER — Ambulatory Visit (INDEPENDENT_AMBULATORY_CARE_PROVIDER_SITE_OTHER): Payer: Medicare Other | Admitting: Urology

## 2022-02-01 VITALS — BP 117/73 | HR 54 | Ht 72.0 in | Wt 193.0 lb

## 2022-02-01 DIAGNOSIS — R6882 Decreased libido: Secondary | ICD-10-CM | POA: Diagnosis not present

## 2022-02-01 NOTE — Progress Notes (Signed)
02/01/2022 8:31 AM   Marguerite Olea Lucky Rathke Apr 20, 1951 [id number]  Referring provider: Olin Hauser, DO [address] [place],  [place] [address]  Chief Complaint  Patient presents with   Follow-up    Urologic history: 1.  Elevated PSA Benign Prostate Biopsy (~2007) following mid 4 range PSA  PSA up to 6.2 with MRI revealing PI-RADS 3/PI-RADS 4 lesions with 56-gram prostate volume.  Fusion biopsy (12/30/2017) at [facility]. Pathology: Standard 12 core biopsy and targeted lesions all showed benign prostate tissue.  One core from the targeted biopsy did show focal chronic inflammatory change. Last PSA (07/07/2018) was 3.7   2.  BPH with lower urinary tract symptoms Currently on silodosin   3.  History of stone disease s/p endoscopic stone removal in 1986  4.  Left spermatocele   HPI: 71 y.o. male presents for 1 month follow-up.  At his visit last month he was complaining of decreased libido.  Total testosterone level was low at 255 ng/dL. Repeat testosterone level was performed which was normal at 422 ng/dL.  A free testosterone level was run on the specimen which was also normal at 73.6 pg/mL  PMH: Past Medical History:  Diagnosis Date   Elevated PSA    GERD (gastroesophageal reflux disease)    Hearing loss     Surgical History: Past Surgical History:  Procedure Laterality Date   SQUAMOUS CELL CARCINOMA EXCISION Right 2020   arm    TONSILLECTOMY     age 51    Home Medications:  Allergies as of 02/01/2022   No Known Allergies      Medication List        Accurate as of February 01, 2022  8:31 AM. If you have any questions, ask your nurse or doctor.          acetic acid-hydrocortisone OTIC solution Commonly known as: VOSOL-HC Place 3 drops into both ears 3 (three) times daily.   Multi-Vitamins Tabs Take by mouth.   omeprazole 40 MG capsule Commonly known as: PRILOSEC TAKE 1 CAPSULE DAILY   rosuvastatin 5 MG tablet Commonly known as: CRESTOR Take 1  tablet (5 mg total) by mouth every other day.   silodosin 8 MG Caps capsule Commonly known as: RAPAFLO Take 1 capsule (8 mg total) by mouth daily with breakfast.   Vitamin D3 25 MCG (1000 UT) Caps Take 5,000 Units by mouth.   vitamin E 180 MG (400 UNITS) capsule Take by mouth.        Allergies: No Known Allergies  Family History: Family History  Problem Relation Age of Onset   Dementia Mother    Cancer Father        throat/stomach   Dementia Maternal Aunt    Cancer Paternal Grandmother    Cancer Paternal Grandfather    Dementia Maternal Uncle     Social History:  reports that he has never smoked. His smokeless tobacco use includes chew. He reports current alcohol use. He reports that he does not use drugs.   Physical Exam: BP 117/73   Pulse (!) 54   Ht 6' (1.829 m)   Wt 193 lb (87.5 kg)   BMI 26.18 kg/m   Constitutional:  Alert and oriented, No acute distress. HEENT: [place] AT Respiratory: Normal respiratory effort, no increased work of breathing. GU: Resolution of left spermatocele Skin: No rashes, bruises or suspicious lesions. Neurologic: Grossly intact, no focal deficits, moving all 4 extremities. Psychiatric: Normal mood and affect.   Assessment & Plan:  1.   Low libido We discussed that 2 abnormal testosterone levels are recommended to establish a diagnosis of hypogonadism. We discussed a brief trial of Clomid to see if this would improve his libido.  The medication is off label for low testosterone and not covered by insurance The trend of repeating a testosterone in 6 weeks was discussed and he elected this option.  He will be notified with the results and further recommendations.    Abbie Sons, [place] [address], [place], [place] [address] [phone]

## 2022-03-20 ENCOUNTER — Other Ambulatory Visit: Payer: Medicare Other

## 2022-03-20 DIAGNOSIS — R6882 Decreased libido: Secondary | ICD-10-CM | POA: Diagnosis not present

## 2022-03-26 LAB — TESTOSTERONE,FREE AND TOTAL
Testosterone, Free: 4.9 pg/mL — ABNORMAL LOW (ref 6.6–18.1)
Testosterone: 459 ng/dL (ref 264–916)

## 2022-03-27 ENCOUNTER — Encounter: Payer: Self-pay | Admitting: *Deleted

## 2022-05-22 DIAGNOSIS — H04129 Dry eye syndrome of unspecified lacrimal gland: Secondary | ICD-10-CM | POA: Diagnosis not present

## 2022-05-22 DIAGNOSIS — H43393 Other vitreous opacities, bilateral: Secondary | ICD-10-CM | POA: Diagnosis not present

## 2022-05-22 DIAGNOSIS — H26493 Other secondary cataract, bilateral: Secondary | ICD-10-CM | POA: Diagnosis not present

## 2022-05-24 ENCOUNTER — Ambulatory Visit (INDEPENDENT_AMBULATORY_CARE_PROVIDER_SITE_OTHER): Payer: Medicare Other | Admitting: Family Medicine

## 2022-05-24 ENCOUNTER — Encounter: Payer: Self-pay | Admitting: Family Medicine

## 2022-05-24 VITALS — BP 110/63 | HR 60 | Ht 72.0 in | Wt 198.6 lb

## 2022-05-24 DIAGNOSIS — J9801 Acute bronchospasm: Secondary | ICD-10-CM

## 2022-05-24 DIAGNOSIS — J0110 Acute frontal sinusitis, unspecified: Secondary | ICD-10-CM

## 2022-05-24 MED ORDER — AMOXICILLIN-POT CLAVULANATE 875-125 MG PO TABS: 1.0000 | ORAL_TABLET | Freq: Two times a day (BID) | ORAL | 0 refills | Status: DC

## 2022-05-24 MED ORDER — PREDNISONE 20 MG PO TABS: ORAL_TABLET | ORAL | 0 refills | Status: DC

## 2022-05-24 MED ORDER — ALBUTEROL SULFATE HFA 108 (90 BASE) MCG/ACT IN AERS: 2.0000 | INHALATION_SPRAY | Freq: Four times a day (QID) | RESPIRATORY_TRACT | 0 refills | Status: DC | PRN

## 2022-05-24 NOTE — Progress Notes (Signed)
Subjective:    Patient ID: Rodney Cummings, male    DOB: 09/11/1950, 71 y.o.   MRN: [id number]  Rodney Cummings is a 71 y.o. male presenting on 05/24/2022 for Cough and Nasal Congestion   HPI  Sinusitis Episodic symptoms 3 weeks, some worse then better, now more dry cough and sinus congestion Tried NyQuil and Mucinex D.M. Bronchospasm spells with tightness in chest triggering coughing spells Admits sinus pain pressure Denies fever or aches or nausea vomiting   Significant leg cramps on Rosuvastatin. Improved off medicine.      05/24/2022    2:13 PM 01/04/2022    1:28 PM 12/03/2021    9:32 AM  Depression screen PHQ 2/9  Decreased Interest '3 1 3  '$ Down, Depressed, Hopeless 0 0 0  PHQ - 2 Score '3 1 3  '$ Altered sleeping 0 1 2  Tired, decreased energy '3 1 2  '$ Change in appetite 1 0 0  Feeling bad or failure about yourself  0 0 0  Trouble concentrating 3 0 0  Moving slowly or fidgety/restless 0 0 0  Suicidal thoughts 0 0 0  PHQ-9 Score '10 3 7  '$ Difficult doing work/chores Extremely dIfficult Not difficult at all Somewhat difficult    Past Medical History:  Diagnosis Date   Elevated PSA    GERD (gastroesophageal reflux disease)    Hearing loss    Past Surgical History:  Procedure Laterality Date   SQUAMOUS CELL CARCINOMA EXCISION Right 2020   arm    TONSILLECTOMY     age 73   Social History   Socioeconomic History   Marital status: Married    Spouse name: Not on file   Number of children: Not on file   Years of education: Not on file   Highest education level: Not on file  Occupational History   Occupation: retired  Tobacco Use   Smoking status: Never   Smokeless tobacco: Current    Types: Chew  Vaping Use   Vaping Use: Never used  Substance and Sexual Activity   Alcohol use: Yes    Alcohol/week: 0.0 standard drinks of alcohol    Comment: "beer on weekends"    Drug use: No   Sexual activity: Yes  Other Topics Concern   Not on file  Social History  Narrative   Not on file   Social Determinants of Health   Financial Resource Strain: Low Risk  (01/04/2022)   Overall Financial Resource Strain (CARDIA)    Difficulty of Paying Living Expenses: Not hard at all  Food Insecurity: No Food Insecurity (01/04/2022)   Hunger Vital Sign    Worried About Running Out of Food in the Last Year: Never true    Ran Out of Food in the Last Year: Never true  Transportation Needs: No Transportation Needs (01/04/2022)   PRAPARE - Hydrologist (Medical): No    Lack of Transportation (Non-Medical): No  Physical Activity: Sufficiently Active (01/04/2022)   Exercise Vital Sign    Days of Exercise per Week: 3 days    Minutes of Exercise per Session: 60 min  Stress: No Stress Concern Present (01/04/2022)   [place]    Feeling of Stress : Not at all  Social Connections: [place] (01/04/2022)   Social Connection and Isolation Panel [NHANES]    Frequency of Communication with Friends and Family: More than three times a week    Frequency of Social  Gatherings with Friends and Family: Three times a week    Attends Religious Services: More than 4 times per year    Active Member of Clubs or Organizations: Yes    Attends Archivist Meetings: More than 4 times per year    Marital Status: Married  Human resources officer Violence: Not At Risk (01/04/2022)   Humiliation, Afraid, Rape, and Kick questionnaire    Fear of Current or Ex-Partner: No    Emotionally Abused: No    Physically Abused: No    Sexually Abused: No   Family History  Problem Relation Age of Onset   Dementia Mother    Cancer Father        throat/stomach   Dementia Maternal Aunt    Cancer Paternal Grandmother    Cancer Paternal Grandfather    Dementia Maternal Uncle    Current Outpatient Medications on File Prior to Visit  Medication Sig   acetic acid-hydrocortisone (VOSOL-HC) OTIC  solution Place 3 drops into both ears 3 (three) times daily.   Cholecalciferol (VITAMIN D3) 1000 units CAPS Take 5,000 Units by mouth.    Multiple Vitamin (MULTI-VITAMINS) TABS Take by mouth.   omeprazole (PRILOSEC) 40 MG capsule TAKE 1 CAPSULE DAILY   silodosin (RAPAFLO) 8 MG CAPS capsule Take 1 capsule (8 mg total) by mouth daily with breakfast.   vitamin E 180 MG (400 UNITS) capsule Take by mouth.   No current facility-administered medications on file prior to visit.    Review of Systems Per HPI unless specifically indicated above      Objective:    BP 110/63   Pulse 60   Ht 6' (1.829 m)   Wt 198 lb 9.6 oz (90.1 kg)   SpO2 99%   BMI 26.94 kg/m   Wt Readings from Last 3 Encounters:  05/24/22 198 lb 9.6 oz (90.1 kg)  02/01/22 193 lb (87.5 kg)  01/22/22 200 lb 12.8 oz (91.1 kg)    Physical Exam Vitals and nursing note reviewed.  Constitutional:      General: He is not in acute distress.    Appearance: Normal appearance. He is well-developed. He is not diaphoretic.     Comments: Well-appearing, comfortable, cooperative  HENT:     Head: Normocephalic and atraumatic.  Eyes:     General:        Right eye: No discharge.        Left eye: No discharge.     Conjunctiva/sclera: Conjunctivae normal.  Cardiovascular:     Rate and Rhythm: Normal rate.  Pulmonary:     Effort: Pulmonary effort is normal.     Breath sounds: Wheezing present.     Comments: bronchospasm Skin:    General: Skin is warm and dry.     Findings: No erythema or rash.  Neurological:     Mental Status: He is alert and oriented to person, place, and time.  Psychiatric:        Mood and Affect: Mood normal.        Behavior: Behavior normal.        Thought Content: Thought content normal.     Comments: Well groomed, good eye contact, normal speech and thoughts    Results for orders placed or performed in visit on 03/20/22  Testosterone,Free and Total  Result Value Ref Range   Testosterone 459 264 -  916 ng/dL   Testosterone, Free 4.9 (L) 6.6 - 18.1 pg/mL      Assessment & Plan:   Problem List  Items Addressed This Visit   None Visit Diagnoses     Acute non-recurrent frontal sinusitis    -  Primary   Relevant Medications   albuterol (VENTOLIN HFA) 108 (90 Base) MCG/ACT inhaler   amoxicillin-clavulanate (AUGMENTIN) 875-125 MG tablet   predniSONE (DELTASONE) 20 MG tablet   Bronchospasm, acute       Relevant Medications   albuterol (VENTOLIN HFA) 108 (90 Base) MCG/ACT inhaler   predniSONE (DELTASONE) 20 MG tablet       Consistent with acute frontal sinusitis, likely initially viral URI vs allergic rhinitis component with worsening concern for bacterial infection.   Plan: 1.  Start Augmentin 875-'125mg'$  PO BID x 10 days 2. Prednisone taper for bronchospasm 3. Albuterol inhaler as needed Nasal saline, OTC meds  Return criteria reviewed consider Chest X-ray if need   Meds ordered this encounter  Medications   albuterol (VENTOLIN HFA) 108 (90 Base) MCG/ACT inhaler    Sig: Inhale 2 puffs into the lungs every 6 (six) hours as needed for wheezing or shortness of breath.    Dispense:  8 g    Refill:  0   amoxicillin-clavulanate (AUGMENTIN) 875-125 MG tablet    Sig: Take 1 tablet by mouth 2 (two) times daily.    Dispense:  20 tablet    Refill:  0   predniSONE (DELTASONE) 20 MG tablet    Sig: Take daily with food. Start with '60mg'$  (3 pills) x 2 days, then reduce to '40mg'$  (2 pills) x 2 days, then '20mg'$  (1 pill) x 3 days    Dispense:  13 tablet    Refill:  0      Follow up plan: Return if symptoms worsen or fail to improve.  Nobie Putnam, [facility] 05/24/2022, 2:42 PM

## 2022-05-24 NOTE — Patient Instructions (Addendum)
Thank you for coming to the office today.  1. It sounds like you have a Sinusitis (Bacterial Infection) - this most likely started as an Upper Respiratory Virus that has settled into an infection. Allergies can also cause this. - Start Augmentin 1 pill twice daily (breakfast and dinner, with food and plenty of water) for 10 days, complete entire course, do not stop early even if feeling better - Albuterol rescue inhaler 1-2 puffs as needed for coughing spells - Prednisone taper follow instructions Take daily with food. Start with '60mg'$  (3 pills) x 2 days, then reduce to '40mg'$  (2 pills) x 2 days, then '20mg'$  (1 pill) x 3 days   = Keep using nasal saline - Future try OTC Flonase  - Improve hydration by drinking plenty of clear fluids (water, gatorade) to reduce secretions and thin congestion - Congestion draining down throat can cause irritation. May try warm herbal tea with honey, cough drops - Can take Tylenol or Ibuprofen as needed for fevers - May continue over the counter cold medicine as you are, I would not use any decongestant or mucinex longer than 7 days.   Please schedule a Follow-up Appointment to: Return if symptoms worsen or fail to improve.  If you have any other questions or concerns, please feel free to call the office or send a message through [place]. You may also schedule an earlier appointment if necessary.  Additionally, you may be receiving a survey about your experience at our office within a few days to 1 week by e-mail or mail. We value your feedback.  Nobie Putnam, DO [place]

## 2022-05-25 ENCOUNTER — Encounter: Payer: Self-pay | Admitting: Family Medicine

## 2022-06-13 ENCOUNTER — Telehealth: Payer: Self-pay | Admitting: Family Medicine

## 2022-06-13 NOTE — Telephone Encounter (Signed)
updated

## 2022-06-13 NOTE — Telephone Encounter (Signed)
Copied from [place] [id number]. Topic: General - Inquiry >> Jun 12, 2022  9:41 AM Leilani Able wrote: Reason for CRM: Pt wondering if Dr Raliegh Ip would sign off on a  concealed gun permit fu at [phone] Gwenevere Ghazi is  from Administrative of  Court Form pertaining to mental health  # [phone], REV 2019   [id number] States Dr Luan Pulling had signed off prior. >> Jun 12, 2022 11:01 AM Earlene Plater D wrote: Provider will look in to this and address it further.   I reviewed his chart. I do not see any history of any mental health problem including depression, anxiety or other concerns.  I do not see any mental health reason that would limit him from proceeding with this application/form.  I should be able to sign it. But first please call patient to check and make sure we are not missing anything with his mental health history and if he has the form instructions for [place] to complete it / due date etc.  Nobie Putnam, [facility] 06/13/2022, 2:28 PM

## 2022-07-19 ENCOUNTER — Other Ambulatory Visit: Payer: Self-pay | Admitting: *Deleted

## 2022-07-19 DIAGNOSIS — R972 Elevated prostate specific antigen [PSA]: Secondary | ICD-10-CM

## 2022-07-22 ENCOUNTER — Other Ambulatory Visit: Payer: Medicare Other

## 2022-07-22 DIAGNOSIS — R972 Elevated prostate specific antigen [PSA]: Secondary | ICD-10-CM | POA: Diagnosis not present

## 2022-07-23 ENCOUNTER — Encounter: Payer: Self-pay | Admitting: *Deleted

## 2022-07-23 LAB — PSA: Prostate Specific Ag, Serum: 3.9 ng/mL (ref 0.0–4.0)

## 2022-08-27 ENCOUNTER — Other Ambulatory Visit: Payer: Self-pay | Admitting: Family Medicine

## 2022-08-27 DIAGNOSIS — K2100 Gastro-esophageal reflux disease with esophagitis, without bleeding: Secondary | ICD-10-CM

## 2022-08-28 NOTE — Telephone Encounter (Signed)
Requested Prescriptions  Pending Prescriptions Disp Refills   omeprazole (PRILOSEC) 40 MG capsule [Pharmacy Med Name: OMEPRAZOLE DR CAPS 40MG ] 90 capsule 0    Sig: TAKE 1 CAPSULE DAILY     Gastroenterology: Proton Pump Inhibitors Passed - 08/27/2022 10:32 AM      Passed - Valid encounter within last 12 months    Recent Outpatient Visits           3 months ago Acute non-recurrent frontal sinusitis   [facility] [place], Devonne Doughty, DO   7 months ago Benign paroxysmal positional vertigo due to bilateral vestibular disorder   [place], DO   8 months ago Pain and swelling of left lower leg   [place], DO   2 years ago Primary osteoarthritis involving multiple joints   [place], DO   2 years ago Right hamstring injury, initial encounter   [place], DO       Future Appointments             In 4 months Stoioff, Ronda Fairly, MD [place]

## 2022-09-23 ENCOUNTER — Encounter: Payer: Self-pay | Admitting: *Deleted

## 2022-11-26 ENCOUNTER — Other Ambulatory Visit: Payer: Self-pay | Admitting: Family Medicine

## 2022-11-26 DIAGNOSIS — K2100 Gastro-esophageal reflux disease with esophagitis, without bleeding: Secondary | ICD-10-CM

## 2022-11-26 NOTE — Telephone Encounter (Signed)
Requested Prescriptions  Pending Prescriptions Disp Refills   omeprazole (PRILOSEC) 40 MG capsule [Pharmacy Med Name: OMEPRAZOLE DR CAPS 40MG ] 90 capsule 3    Sig: TAKE 1 CAPSULE DAILY     Gastroenterology: Proton Pump Inhibitors Passed - 11/26/2022  9:06 AM      Passed - Valid encounter within last 12 months    Recent Outpatient Visits           6 months ago Acute non-recurrent frontal sinusitis   [facility] [place], Netta Neat, DO   10 months ago Benign paroxysmal positional vertigo due to bilateral vestibular disorder   [facility] [place] Smitty Cords, DO   11 months ago Pain and swelling of left lower leg   [facility] Smitty Cords, DO   2 years ago Primary osteoarthritis involving multiple joints   [place] [facility] Smitty Cords, DO   2 years ago Right hamstring injury, initial encounter   [facility] Smitty Cords, DO       Future Appointments             In 1 month Stoioff, Verna Czech, MD [facility]

## 2023-01-10 ENCOUNTER — Other Ambulatory Visit: Payer: Self-pay | Admitting: Family Medicine

## 2023-01-10 ENCOUNTER — Ambulatory Visit (INDEPENDENT_AMBULATORY_CARE_PROVIDER_SITE_OTHER): Payer: Medicare Other

## 2023-01-10 DIAGNOSIS — R972 Elevated prostate specific antigen [PSA]: Secondary | ICD-10-CM

## 2023-01-10 DIAGNOSIS — Z0000 Encounter for general adult medical examination without abnormal findings: Secondary | ICD-10-CM

## 2023-01-10 NOTE — Progress Notes (Signed)
Subjective:   Rodney Cummings is a 72 y.o. male who presents for Medicare Annual/Subsequent preventive examination.  Visit Complete: Virtual  I connected with  Rodney Cummings on 01/10/23 by a audio enabled telemedicine application and verified that I am speaking with the correct person using two identifiers.  Patient Location: Home  Provider Location: Office/Clinic  I discussed the limitations of evaluation and management by telemedicine. The patient expressed understanding and agreed to proceed.  Vital Signs: Patient was unable to self-report vital signs via telehealth due to a lack of equipment at home.  Review of Systems     Cardiac Risk Factors include: advanced age (>102men, >23 women);male gender;dyslipidemia;obesity (BMI >30kg/m2)     Objective:    Today's Vitals   01/10/23 1327  PainSc: 4    There is no height or weight on file to calculate BMI.     01/10/2023    1:35 PM 01/04/2022    1:29 PM 11/07/2020    1:24 PM 11/02/2019    1:27 PM  Advanced Directives  Does Patient Have a Medical Advance Directive? No No Yes Yes  Type of Surveyor, minerals;Living will Living will;Healthcare Power of Attorney  Copy of Healthcare Power of Attorney in Chart?   No - copy requested No - copy requested  Would patient like information on creating a medical advance directive? No - Patient declined No - Patient declined      Current Medications (verified) Outpatient Encounter Medications as of 01/10/2023  Medication Sig   acetic acid-hydrocortisone (VOSOL-HC) OTIC solution Place 3 drops into both ears 3 (three) times daily.   Cholecalciferol (VITAMIN D3) 1000 units CAPS Take 5,000 Units by mouth.    Multiple Vitamin (MULTI-VITAMINS) TABS Take by mouth.   omeprazole (PRILOSEC) 40 MG capsule TAKE 1 CAPSULE DAILY   silodosin (RAPAFLO) 8 MG CAPS capsule Take 1 capsule (8 mg total) by mouth daily with breakfast.   vitamin E 180 MG (400 UNITS) capsule Take by  mouth.   [DISCONTINUED] albuterol (VENTOLIN HFA) 108 (90 Base) MCG/ACT inhaler Inhale 2 puffs into the lungs every 6 (six) hours as needed for wheezing or shortness of breath.   [DISCONTINUED] amoxicillin-clavulanate (AUGMENTIN) 875-125 MG tablet Take 1 tablet by mouth 2 (two) times daily.   [DISCONTINUED] predniSONE (DELTASONE) 20 MG tablet Take daily with food. Start with 60mg  (3 pills) x 2 days, then reduce to 40mg  (2 pills) x 2 days, then 20mg  (1 pill) x 3 days   No facility-administered encounter medications on file as of 01/10/2023.    Allergies (verified) Patient has no known allergies.   History: Past Medical History:  Diagnosis Date   Elevated PSA    GERD (gastroesophageal reflux disease)    Hearing loss    Past Surgical History:  Procedure Laterality Date   SQUAMOUS CELL CARCINOMA EXCISION Right 2020   arm    TONSILLECTOMY     age 67   Family History  Problem Relation Age of Onset   Dementia Mother    Cancer Father        throat/stomach   Dementia Maternal Aunt    Cancer Paternal Grandmother    Cancer Paternal Grandfather    Dementia Maternal Uncle    Social History   Socioeconomic History   Marital status: Married    Spouse name: Not on file   Number of children: Not on file   Years of education: Not on file   Highest education level:  Not on file  Occupational History   Occupation: retired  Tobacco Use   Smoking status: Never   Smokeless tobacco: Current    Types: Chew  Vaping Use   Vaping status: Never Used  Substance and Sexual Activity   Alcohol use: Yes    Alcohol/week: 0.0 standard drinks of alcohol    Comment: "beer on weekends"    Drug use: No   Sexual activity: Yes  Other Topics Concern   Not on file  Social History Narrative   Not on file   Social Determinants of Health   Financial Resource Strain: Low Risk  (01/10/2023)   Overall Financial Resource Strain (CARDIA)    Difficulty of Paying Living Expenses: Not hard at all  Food  Insecurity: No Food Insecurity (01/10/2023)   Hunger Vital Sign    Worried About Running Out of Food in the Last Year: Never true    Ran Out of Food in the Last Year: Never true  Transportation Needs: No Transportation Needs (01/10/2023)   PRAPARE - Administrator, Civil Service (Medical): No    Lack of Transportation (Non-Medical): No  Physical Activity: Sufficiently Active (01/10/2023)   Exercise Vital Sign    Days of Exercise per Week: 5 days    Minutes of Exercise per Session: 60 min  Stress: No Stress Concern Present (01/10/2023)   [employer] of Occupational Health - Occupational Stress Questionnaire    Feeling of Stress : Not at all  Social Connections: Socially Integrated (01/10/2023)   Social Connection and Isolation Panel [NHANES]    Frequency of Communication with Friends and Family: More than three times a week    Frequency of Social Gatherings with Friends and Family: Three times a week    Attends Religious Services: More than 4 times per year    Active Member of Clubs or Organizations: Yes    Attends Engineer, structural: More than 4 times per year    Marital Status: Married    Tobacco Counseling Ready to quit: Not Answered Counseling given: Not Answered   Clinical Intake:  Pre-visit preparation completed: Yes  Pain : 0-10 Pain Score: 4  Pain Type: Chronic pain Pain Location: Foot Pain Orientation: Left, Posterior Pain Relieving Factors: staying off feet  Pain Relieving Factors: staying off feet  Nutritional Risks: None Diabetes: No  How often do you need to have someone help you when you read instructions, pamphlets, or other written materials from your doctor or pharmacy?: 1 - Never  Interpreter Needed?: No  Information entered by :: Kennedy Bucker, LPN   Activities of Daily Living    01/10/2023    1:37 PM 01/06/2023   11:12 AM  In your present state of health, do you have any difficulty performing the following activities:   Hearing? 0 0  Vision? 0 0  Difficulty concentrating or making decisions? 0 0  Walking or climbing stairs? 0 0  Dressing or bathing? 0 0  Doing errands, shopping? 0 0  Preparing Food and eating ? N N  Using the Toilet? N N  In the past six months, have you accidently leaked urine? Y Y  Do you have problems with loss of bowel control? N N  Managing your Medications? N N  Managing your Finances? N N  Housekeeping or managing your Housekeeping? N N    Patient Care Team: Smitty Cords, DO as PCP - General (Family Medicine)  Indicate any recent Medical Services you may have received from  other than [facility] providers in the past year (date may be approximate).     Assessment:   This is a routine wellness examination for Draco.  Hearing/Vision screen Hearing Screening - Comments:: Wears aids Vision Screening - Comments:: Readers- Dr.Woodard  Dietary issues and exercise activities discussed:     Goals Addressed             This Visit's Progress    DIET - INCREASE WATER INTAKE         Depression Screen    01/10/2023    1:33 PM 05/24/2022    2:13 PM 01/04/2022    1:28 PM 12/03/2021    9:32 AM 11/07/2020    1:25 PM 03/16/2020    9:16 AM 11/02/2019    1:27 PM  PHQ 2/9 Scores  PHQ - 2 Score 0 3 1 3  0 0 0  PHQ- 9 Score 0 10 3 7        Fall Risk    01/10/2023    1:37 PM 01/06/2023   11:12 AM 05/24/2022    2:13 PM 01/04/2022    1:30 PM 12/03/2021    9:32 AM  Fall Risk   Falls in the past year? 0 0 0 0 0  Number falls in past yr: 0 0 0 0 0  Injury with Fall? 0 0 0 0 0  Risk for fall due to : No Fall Risks  No Fall Risks No Fall Risks No Fall Risks  Follow up Falls prevention discussed;Falls evaluation completed  Falls evaluation completed Falls evaluation completed Falls evaluation completed    MEDICARE RISK AT HOME:  Medicare Risk at Home - 01/10/23 1338     Any stairs in or around the home? Yes    If so, are there any without handrails? No    Home free of  loose throw rugs in walkways, pet beds, electrical cords, etc? Yes    Adequate lighting in your home to reduce risk of falls? Yes    Life alert? No    Use of a cane, walker or w/c? No    Grab bars in the bathroom? No    Shower chair or bench in shower? Yes    Elevated toilet seat or a handicapped toilet? No             TIMED UP AND GO:  Was the test performed?  No    Cognitive Function:        01/10/2023    1:38 PM 11/07/2020    1:28 PM  6CIT Screen  What Year? 0 points 0 points  What month? 0 points 0 points  What time? 0 points 0 points  Count back from 20 0 points 0 points  Months in reverse 4 points 4 points  Repeat phrase 0 points 2 points  Total Score 4 points 6 points    Immunizations Immunization History  Administered Date(s) Administered   Fluad Quad(high Dose 65+) 03/16/2019, 03/16/2020   Influenza, High Dose Seasonal PF 04/23/2016, 04/17/2017, 03/18/2018   PFIZER(Purple Top)SARS-COV-2 Vaccination 07/12/2019, 07/18/2019, 08/01/2019, 08/08/2019   Pneumococcal Conjugate-13 10/17/2015   Tdap 10/17/2015    TDAP status: Up to date  Flu Vaccine status: Declined, Education has been provided regarding the importance of this vaccine but patient still declined. Advised may receive this vaccine at local pharmacy or Health Dept. Aware to provide a copy of the vaccination record if obtained from local pharmacy or Health Dept. Verbalized acceptance and understanding.  Pneumococcal vaccine status: Up to  date  Covid-19 vaccine status: Completed vaccines  Qualifies for Shingles Vaccine? Yes   Zostavax completed No   Shingrix Completed?: No.    Education has been provided regarding the importance of this vaccine. Patient has been advised to call insurance company to determine out of pocket expense if they have not yet received this vaccine. Advised may also receive vaccine at local pharmacy or Health Dept. Verbalized acceptance and understanding.  Screening Tests Health  Maintenance  Topic Date Due   Zoster Vaccines- Shingrix (1 of 2) Never done   Pneumonia Vaccine 64+ Years old (2 of 2 - PPSV23 or PCV20) 10/16/2016   COVID-19 Vaccine (5 - 2023-24 season) 02/08/2022   INFLUENZA VACCINE  01/09/2023   Colonoscopy  10/28/2023   Medicare Annual Wellness (AWV)  01/10/2024   DTaP/Tdap/Td (2 - Td or Tdap) 10/16/2025   Hepatitis C Screening  Completed   HPV VACCINES  Aged Out    Health Maintenance  Health Maintenance Due  Topic Date Due   Zoster Vaccines- Shingrix (1 of 2) Never done   Pneumonia Vaccine 32+ Years old (2 of 2 - PPSV23 or PCV20) 10/16/2016   COVID-19 Vaccine (5 - 2023-24 season) 02/08/2022   INFLUENZA VACCINE  01/09/2023    Colorectal cancer screening: Type of screening: Colonoscopy. Completed 10/27/13. Repeat every 10 years  Lung Cancer Screening: (Low Dose CT Chest recommended if Age 32-80 years, 20 pack-year currently smoking OR have quit w/in 15years.) does not qualify.    Additional Screening:  Hepatitis C Screening: does qualify; Completed 01/15/17  Vision Screening: Recommended annual ophthalmology exams for early detection of glaucoma and other disorders of the eye. Is the patient up to date with their annual eye exam?  Yes  Who is the provider or what is the name of the office in which the patient attends annual eye exams? Dr.Woodard If pt is not established with a provider, would they like to be referred to a provider to establish care? No .   Dental Screening: Recommended annual dental exams for proper oral hygiene   Community Resource Referral / Chronic Care Management: CRR required this visit?  No   CCM required this visit?  No     Plan:     I have personally reviewed and noted the following in the patient's chart:   Medical and social history Use of alcohol, tobacco or illicit drugs  Current medications and supplements including opioid prescriptions. Patient is not currently taking opioid  prescriptions. Functional ability and status Nutritional status Physical activity Advanced directives List of other physicians Hospitalizations, surgeries, and ER visits in previous 12 months Vitals Screenings to include cognitive, depression, and falls Referrals and appointments  In addition, I have reviewed and discussed with patient certain preventive protocols, quality metrics, and best practice recommendations. A written personalized care plan for preventive services as well as general preventive health recommendations were provided to patient.     Hal Hope, LPN   4/0/9811   After Visit Summary: (MyChart) Due to this being a telephonic visit, the after visit summary with patients personalized plan was offered to patient via MyChart   Nurse Notes: none

## 2023-01-10 NOTE — Patient Instructions (Addendum)
Rodney Cummings , Thank you for taking time to come for your Medicare Wellness Visit. I appreciate your ongoing commitment to your health goals. Please review the following plan we discussed and let me know if I can assist you in the future.   Referrals/Orders/Follow-Ups/Clinician Recommendations: none  This is a list of the screening recommended for you and due dates:  Health Maintenance  Topic Date Due   Zoster (Shingles) Vaccine (1 of 2) Never done   Pneumonia Vaccine (2 of 2 - PPSV23 or PCV20) 10/16/2016   COVID-19 Vaccine (5 - 2023-24 season) 02/08/2022   Flu Shot  01/09/2023   Colon Cancer Screening  10/28/2023   Medicare Annual Wellness Visit  01/10/2024   DTaP/Tdap/Td vaccine (2 - Td or Tdap) 10/16/2025   Hepatitis C Screening  Completed   HPV Vaccine  Aged Out    Advanced directives: (Declined) Advance directive discussed with you today. Even though you declined this today, please call our office should you change your mind, and we can give you the proper paperwork for you to fill out.  Next Medicare Annual Wellness Visit scheduled for next year: Yes   01/16/24 @ 1:30 pm by phone  Preventive Care 65 Years and Older, Male  Preventive care refers to lifestyle choices and visits with your health care provider that can promote health and wellness. What does preventive care include? A yearly physical exam. This is also called an annual well check. Dental exams once or twice a year. Routine eye exams. Ask your health care provider how often you should have your eyes checked. Personal lifestyle choices, including: Daily care of your teeth and gums. Regular physical activity. Eating a healthy diet. Avoiding tobacco and drug use. Limiting alcohol use. Practicing safe sex. Taking low doses of aspirin every day. Taking vitamin and mineral supplements as recommended by your health care provider. What happens during an annual well check? The services and screenings done by your health care  provider during your annual well check will depend on your age, overall health, lifestyle risk factors, and family history of disease. Counseling  Your health care provider may ask you questions about your: Alcohol use. Tobacco use. Drug use. Emotional well-being. Home and relationship well-being. Sexual activity. Eating habits. History of falls. Memory and ability to understand (cognition). Work and work Astronomer. Screening  You may have the following tests or measurements: Height, weight, and BMI. Blood pressure. Lipid and cholesterol levels. These may be checked every 5 years, or more frequently if you are over 67 years old. Skin check. Lung cancer screening. You may have this screening every year starting at age 55 if you have a 30-pack-year history of smoking and currently smoke or have quit within the past 15 years. Fecal occult blood test (FOBT) of the stool. You may have this test every year starting at age 55. Flexible sigmoidoscopy or colonoscopy. You may have a sigmoidoscopy every 5 years or a colonoscopy every 10 years starting at age 70. Prostate cancer screening. Recommendations will vary depending on your family history and other risks. Hepatitis C blood test. Hepatitis B blood test. Sexually transmitted disease (STD) testing. Diabetes screening. This is done by checking your blood sugar (glucose) after you have not eaten for a while (fasting). You may have this done every 1-3 years. Abdominal aortic aneurysm (AAA) screening. You may need this if you are a current or former smoker. Osteoporosis. You may be screened starting at age 38 if you are at high risk. Talk  with your health care provider about your test results, treatment options, and if necessary, the need for more tests. Vaccines  Your health care provider may recommend certain vaccines, such as: Influenza vaccine. This is recommended every year. Tetanus, diphtheria, and acellular pertussis (Tdap, Td)  vaccine. You may need a Td booster every 10 years. Zoster vaccine. You may need this after age 53. Pneumococcal 13-valent conjugate (PCV13) vaccine. One dose is recommended after age 40. Pneumococcal polysaccharide (PPSV23) vaccine. One dose is recommended after age 14. Talk to your health care provider about which screenings and vaccines you need and how often you need them. This information is not intended to replace advice given to you by your health care provider. Make sure you discuss any questions you have with your health care provider. Document Released: 06/23/2015 Document Revised: 02/14/2016 Document Reviewed: 03/28/2015 Elsevier Interactive Patient Education  2017 [employer].  Fall Prevention in the Home Falls can cause injuries. They can happen to people of all ages. There are many things you can do to make your home safe and to help prevent falls. What can I do on the outside of my home? Regularly fix the edges of walkways and driveways and fix any cracks. Remove anything that might make you trip as you walk through a door, such as a raised step or threshold. Trim any bushes or trees on the path to your home. Use bright outdoor lighting. Clear any walking paths of anything that might make someone trip, such as rocks or tools. Regularly check to see if handrails are loose or broken. Make sure that both sides of any steps have handrails. Any raised decks and porches should have guardrails on the edges. Have any leaves, snow, or ice cleared regularly. Use sand or salt on walking paths during winter. Clean up any spills in your garage right away. This includes oil or grease spills. What can I do in the bathroom? Use night lights. Install grab bars by the toilet and in the tub and shower. Do not use towel bars as grab bars. Use non-skid mats or decals in the tub or shower. If you need to sit down in the shower, use a plastic, non-slip stool. Keep the floor dry. Clean up any  water that spills on the floor as soon as it happens. Remove soap buildup in the tub or shower regularly. Attach bath mats securely with double-sided non-slip rug tape. Do not have throw rugs and other things on the floor that can make you trip. What can I do in the bedroom? Use night lights. Make sure that you have a light by your bed that is easy to reach. Do not use any sheets or blankets that are too big for your bed. They should not hang down onto the floor. Have a firm chair that has side arms. You can use this for support while you get dressed. Do not have throw rugs and other things on the floor that can make you trip. What can I do in the kitchen? Clean up any spills right away. Avoid walking on wet floors. Keep items that you use a lot in easy-to-reach places. If you need to reach something above you, use a strong step stool that has a grab bar. Keep electrical cords out of the way. Do not use floor polish or wax that makes floors slippery. If you must use wax, use non-skid floor wax. Do not have throw rugs and other things on the floor that can make you  trip. What can I do with my stairs? Do not leave any items on the stairs. Make sure that there are handrails on both sides of the stairs and use them. Fix handrails that are broken or loose. Make sure that handrails are as long as the stairways. Check any carpeting to make sure that it is firmly attached to the stairs. Fix any carpet that is loose or worn. Avoid having throw rugs at the top or bottom of the stairs. If you do have throw rugs, attach them to the floor with carpet tape. Make sure that you have a light switch at the top of the stairs and the bottom of the stairs. If you do not have them, ask someone to add them for you. What else can I do to help prevent falls? Wear shoes that: Do not have high heels. Have rubber bottoms. Are comfortable and fit you well. Are closed at the toe. Do not wear sandals. If you use a  stepladder: Make sure that it is fully opened. Do not climb a closed stepladder. Make sure that both sides of the stepladder are locked into place. Ask someone to hold it for you, if possible. Clearly mark and make sure that you can see: Any grab bars or handrails. First and last steps. Where the edge of each step is. Use tools that help you move around (mobility aids) if they are needed. These include: Canes. Walkers. Scooters. Crutches. Turn on the lights when you go into a dark area. Replace any light bulbs as soon as they burn out. Set up your furniture so you have a clear path. Avoid moving your furniture around. If any of your floors are uneven, fix them. If there are any pets around you, be aware of where they are. Review your medicines with your doctor. Some medicines can make you feel dizzy. This can increase your chance of falling. Ask your doctor what other things that you can do to help prevent falls. This information is not intended to replace advice given to you by your health care provider. Make sure you discuss any questions you have with your health care provider. Document Released: 03/23/2009 Document Revised: 11/02/2015 Document Reviewed: 07/01/2014 Elsevier Interactive Patient Education  2017 [employer].

## 2023-01-14 ENCOUNTER — Other Ambulatory Visit: Payer: Medicare Other

## 2023-01-14 DIAGNOSIS — R972 Elevated prostate specific antigen [PSA]: Secondary | ICD-10-CM

## 2023-01-17 ENCOUNTER — Encounter: Payer: Self-pay | Admitting: Urology

## 2023-01-17 ENCOUNTER — Ambulatory Visit: Payer: Medicare Other | Admitting: Urology

## 2023-01-17 VITALS — BP 122/80 | HR 70 | Ht 72.0 in | Wt 192.0 lb

## 2023-01-17 DIAGNOSIS — R972 Elevated prostate specific antigen [PSA]: Secondary | ICD-10-CM

## 2023-01-17 DIAGNOSIS — N401 Enlarged prostate with lower urinary tract symptoms: Secondary | ICD-10-CM | POA: Diagnosis not present

## 2023-01-17 DIAGNOSIS — R350 Frequency of micturition: Secondary | ICD-10-CM | POA: Diagnosis not present

## 2023-01-17 MED ORDER — TADALAFIL 5 MG PO TABS: ORAL_TABLET | ORAL | 3 refills | Status: AC

## 2023-01-17 MED ORDER — SILODOSIN 8 MG PO CAPS: 8.0000 mg | ORAL_CAPSULE | Freq: Every day | ORAL | 3 refills | Status: DC

## 2023-01-17 NOTE — Progress Notes (Signed)
I, Maysun Anabel Bene, acting as a scribe for Riki Altes, MD., have documented all relevant documentation on the behalf of Riki Altes, MD, as directed by Riki Altes, MD while in the presence of Riki Altes, MD.  01/17/2023 11:44 AM   Rodney Cummings [birth year] [id number]  Referring provider: Smitty Cords, DO [address] [place],  [place] [address]  Chief Complaint  Patient presents with   Elevated PSA   Urologic history: 1.  Elevated PSA Benign Prostate Biopsy (~2007) following mid 4 range PSA  PSA up to 6.2 with MRI revealing PI-RADS 3/PI-RADS 4 lesions with 56-gram prostate volume.  Fusion biopsy (12/30/2017) at [facility]. Pathology: Standard 12 core biopsy and targeted lesions all showed benign prostate tissue.  One core from the targeted biopsy did show focal chronic inflammatory change. Last PSA (07/07/2018) was 3.7   2.  BPH with lower urinary tract symptoms Currently on silodosin   3.  History of stone disease s/p endoscopic stone removal in 1986   4.  Left spermatocele  HPI: Rodney Cummings is a 72 y.o. male presents for annual follow-up.   Since last year's visit, no significant problems.  When he misses 2-3 days of silodosin, he does have worsening lower urinary tract symptoms.  PSA 01/13/22 stable 4.2 No dysuria or gross hematuria.  He inquired about starting daily tadalafil for ED and BPH.  PSA trend   Prostate Specific Ag, Serum  Latest Ref Rng 0.0 - 4.0 ng/mL  07/07/2018 3.7   01/06/2019 4.1 (H)   07/06/2019 3.3   01/14/2020 3.8   01/15/2021 4.0   01/15/2022 4.9 (H)   07/22/2022 3.9   01/14/2023 4.2 (H)      PMH: Past Medical History:  Diagnosis Date   Elevated PSA    GERD (gastroesophageal reflux disease)    Hearing loss     Surgical History: Past Surgical History:  Procedure Laterality Date   SQUAMOUS CELL CARCINOMA EXCISION Right 2020   arm    TONSILLECTOMY     age 38    Home Medications:  Allergies as of 01/17/2023   No  Known Allergies      Medication List        Accurate as of January 17, 2023 11:44 AM. If you have any questions, ask your nurse or doctor.          acetic acid-hydrocortisone OTIC solution Commonly known as: VOSOL-HC Place 3 drops into both ears 3 (three) times daily.   Multi-Vitamins Tabs Take by mouth.   omeprazole 40 MG capsule Commonly known as: PRILOSEC TAKE 1 CAPSULE DAILY   silodosin 8 MG Caps capsule Commonly known as: RAPAFLO Take 1 capsule (8 mg total) by mouth daily with breakfast.   tadalafil 5 MG tablet Commonly known as: CIALIS daily Started by: Riki Altes   Vitamin D3 25 MCG (1000 UT) Caps Take 5,000 Units by mouth.   vitamin E 180 MG (400 UNITS) capsule Take by mouth.        Allergies: No Known Allergies  Family History: Family History  Problem Relation Age of Onset   Dementia Mother    Cancer Father        throat/stomach   Dementia Maternal Aunt    Cancer Paternal Grandmother    Cancer Paternal Grandfather    Dementia Maternal Uncle     Social History:  reports that he has never smoked. His smokeless tobacco use includes chew. He reports  current alcohol use. He reports that he does not use drugs.   Physical Exam: BP 122/80   Pulse 70   Ht 6' (1.829 m)   Wt 192 lb (87.1 kg)   BMI 26.04 kg/m   Constitutional:  Alert and oriented, No acute distress. HEENT:  AT, moist mucus membranes.  Trachea midline, no masses. Cardiovascular: No clubbing, cyanosis, or edema. Respiratory: Normal respiratory effort, no increased work of breathing. GI: Abdomen is soft, nontender, nondistended, no abdominal masses Skin: No rashes, bruises or suspicious lesions. Neurologic: Grossly intact, no focal deficits, moving all 4 extremities. Psychiatric: Normal mood and affect.   Assessment & Plan:    1. BPH with LUTS  Doing well on silodosin.  Will add tadalafil 5 mg daily per his request.  2. Elevated PSA Stable PSA Continue anual follow  up  [facility] [address] [place], [place] [address] [phone]

## 2023-02-27 ENCOUNTER — Telehealth: Payer: Self-pay | Admitting: *Deleted

## 2023-02-27 NOTE — Telephone Encounter (Signed)
Pt LMOM to see if you could sent in 7 days of Cialis.  He only has 2 left.  He said it would take a while to get mail order.  He uses [facility] in [place].

## 2023-02-27 NOTE — Telephone Encounter (Signed)
[facility] is [facility] .

## 2023-02-27 NOTE — Telephone Encounter (Addendum)
Notified patient as instructed, patient pleased. He understands.

## 2023-02-27 NOTE — Telephone Encounter (Signed)
Pt calling asking if he should only take Rapaflo for 30 days and then stop? He was confused by the 90 day supply that was sent to [facility]. Should he take Rapaflo and Cialis together? Pt also complaining that he only received 30 day supply of cialis and I explained we sent in 90 day supply.

## 2023-02-27 NOTE — Telephone Encounter (Signed)
He indicated at his office visit in August that silodosin was helping his urinary symptoms and if so recommend he take daily indefinitely.  The tadalafil was added for ED and prostate enlargement and okay to take silodosin and tadalafil together.  Some mail-order pharmacies will only send a 30-day supply of certain meds.  He can get a 90-day supply of tadalafil using [facility] locally for around $35

## 2023-04-02 ENCOUNTER — Ambulatory Visit: Payer: Self-pay | Admitting: *Deleted

## 2023-04-02 NOTE — Telephone Encounter (Signed)
  Chief Complaint: left foot pain Symptoms: chronic pain- hurts worse with walking, change in toes on foot Frequency: started- few weeks ago- months Pertinent Negatives: Patient denies discoloration, swelling Disposition: [] ED /[] Urgent Care (no appt availability in office) / [x] Appointment(In office/virtual)/ []  [facility]/ [] Home Care/ [] Refused Recommended Disposition /[] [facility]/ []  Follow-up with PCP Additional Notes: Patient states she has had chronic foot pain- but is worse- he is now limping when he walks- appointment has been scheduled for evaluation

## 2023-04-02 NOTE — Telephone Encounter (Signed)
Reason for Disposition  [1] MILD pain (e.g., does not interfere with normal activities) AND [2] present > 7 days  Answer Assessment - Initial Assessment Questions 1. ONSET: "When did the pain start?"      Few weeks- to months 2. LOCATION: "Where is the pain located?"      Left foot 3. PAIN: "How bad is the pain?"    (Scale 1-10; or mild, moderate, severe)  - MILD (1-3): doesn't interfere with normal activities.   - MODERATE (4-7): interferes with normal activities (e.g., work or school) or awakens from sleep, limping.   - SEVERE (8-10): excruciating pain, unable to do any normal activities, unable to walk.      Mild- more pain with walking- limps 4. WORK OR EXERCISE: "Has there been any recent work or exercise that involved this part of the body?"      unsure 5. CAUSE: "What do you think is causing the foot pain?"     Second and third toe are separated 6. OTHER SYMPTOMS: "Do you have any other symptoms?" (e.g., leg pain, rash, fever, numbness)     no  Protocols used: Foot Pain-A-AH

## 2023-04-04 ENCOUNTER — Ambulatory Visit (INDEPENDENT_AMBULATORY_CARE_PROVIDER_SITE_OTHER): Payer: Medicare Other | Admitting: Family Medicine

## 2023-04-04 ENCOUNTER — Encounter: Payer: Self-pay | Admitting: Family Medicine

## 2023-04-04 VITALS — BP 110/68 | HR 60 | Ht 72.0 in | Wt 193.0 lb

## 2023-04-04 DIAGNOSIS — M79672 Pain in left foot: Secondary | ICD-10-CM | POA: Diagnosis not present

## 2023-04-04 DIAGNOSIS — G5762 Lesion of plantar nerve, left lower limb: Secondary | ICD-10-CM | POA: Diagnosis not present

## 2023-04-04 DIAGNOSIS — Q6671 Congenital pes cavus, right foot: Secondary | ICD-10-CM

## 2023-04-04 DIAGNOSIS — L570 Actinic keratosis: Secondary | ICD-10-CM

## 2023-04-04 DIAGNOSIS — Q6672 Congenital pes cavus, left foot: Secondary | ICD-10-CM | POA: Diagnosis not present

## 2023-04-04 DIAGNOSIS — G629 Polyneuropathy, unspecified: Secondary | ICD-10-CM

## 2023-04-04 DIAGNOSIS — L819 Disorder of pigmentation, unspecified: Secondary | ICD-10-CM | POA: Diagnosis not present

## 2023-04-04 DIAGNOSIS — Z23 Encounter for immunization: Secondary | ICD-10-CM

## 2023-04-04 MED ORDER — GABAPENTIN 100 MG PO CAPS: ORAL_CAPSULE | ORAL | 1 refills | Status: DC

## 2023-04-04 NOTE — Progress Notes (Signed)
Subjective:    Patient ID: Rodney Cummings, male    DOB: 10-21-1950, 72 y.o.   MRN: [id number]  Rodney Cummings is a 72 y.o. male presenting on 04/04/2023 for foot pain (Pt stated---under 2nd/3rd toe--pain w/ pressure, numbness, cramping, worse climbing ladder, flexing--3 months) and coughing (Coughing w/ green mucus, chest pain--3 weeks. Tried night quil, mucinex but no help. Denied fever.)   HPI  Discussed the use of AI scribe software for clinical note transcription with the patient, who gave verbal consent to proceed.     Actinic Keratoses / Abnormal pigmented skin lesions  The patient, with a history of skin cancer, presents with concerns about new skin lesions on the face and nose. He reports a previously seen by Dermatology surgeon [facility] area. But he needs a different location for local Dermatologist now  Left Foot Pain / Neuropathic pain  Additionally, the patient reports pain and pressure in the second and third toes for the past three months. He describes the tips of these toes as numb and notes that the pain is exacerbated when walking. The patient recalls the onset of these symptoms around the time he discontinued Crestor due to severe cramping. He denies any specific injury to the foot.  The patient also mentions plans for upcoming oral surgery to remove all bottom teeth and replace them with implants. He has a consultation scheduled with a dentist in mid-November.      Additionally some bronchitis cough symptoms - he has an antibiotic at the pharmacy already per Dentist   Health Maintenance: Flu Shot today     01/10/2023    1:33 PM 05/24/2022    2:13 PM 01/04/2022    1:28 PM  Depression screen PHQ 2/9  Decreased Interest 0 3 1  Down, Depressed, Hopeless 0 0 0  PHQ - 2 Score 0 3 1  Altered sleeping 0 0 1  Tired, decreased energy 0 3 1  Change in appetite 0 1 0  Feeling bad or failure about yourself  0 0 0  Trouble concentrating 0 3 0  Moving slowly or  fidgety/restless 0 0 0  Suicidal thoughts 0 0 0  PHQ-9 Score 0 10 3  Difficult doing work/chores Not difficult at all Extremely dIfficult Not difficult at all    Social History   Tobacco Use   Smoking status: Never   Smokeless tobacco: Current    Types: Chew  Vaping Use   Vaping status: Never Used  Substance Use Topics   Alcohol use: Yes    Alcohol/week: 0.0 standard drinks of alcohol    Comment: "beer on weekends"    Drug use: No    Review of Systems Per HPI unless specifically indicated above     Objective:    BP 110/68   Pulse 60   Ht 6' (1.829 m)   Wt 193 lb (87.5 kg)   SpO2 98%   BMI 26.18 kg/m   Wt Readings from Last 3 Encounters:  04/04/23 193 lb (87.5 kg)  01/17/23 192 lb (87.1 kg)  05/24/22 198 lb 9.6 oz (90.1 kg)    Physical Exam Vitals and nursing note reviewed.  Constitutional:      General: He is not in acute distress.    Appearance: Normal appearance. He is well-developed. He is not diaphoretic.     Comments: Well-appearing, comfortable, cooperative  HENT:     Head: Normocephalic and atraumatic.  Eyes:     General:  Right eye: No discharge.        Left eye: No discharge.     Conjunctiva/sclera: Conjunctivae normal.  Cardiovascular:     Rate and Rhythm: Normal rate.  Pulmonary:     Effort: Pulmonary effort is normal.  Musculoskeletal:     Comments: Left foot has some splaying of 2nd and 3rd toe, fullness and sensitivity plantar forefoot in this area suggestive of possible morton neuroma  Skin:    General: Skin is warm and dry.     Findings: Lesion (see photos, slightly darker rough patches on face pigmented lesion without ulceration) present. No erythema or rash.  Neurological:     Mental Status: He is alert and oriented to person, place, and time.  Psychiatric:        Mood and Affect: Mood normal.        Behavior: Behavior normal.        Thought Content: Thought content normal.     Comments: Well groomed, good eye contact, normal  speech and thoughts     R nose    R Face cheek   Results for orders placed or performed in visit on 01/14/23  PSA  Result Value Ref Range   Prostate Specific Ag, Serum 4.2 (H) 0.0 - 4.0 ng/mL      Assessment & Plan:   Problem List Items Addressed This Visit   None Visit Diagnoses     Left foot pain    -  Primary   Relevant Orders   Ambulatory referral to Podiatry   AK (actinic keratosis)       Relevant Orders   Ambulatory referral to Dermatology   Atypical pigmented skin lesion       Relevant Orders   Ambulatory referral to Dermatology   Morton neuroma of left foot       Relevant Medications   gabapentin (NEURONTIN) 100 MG capsule   Other Relevant Orders   Ambulatory referral to Podiatry   Peripheral polyneuropathy       Relevant Medications   gabapentin (NEURONTIN) 100 MG capsule   Other Relevant Orders   Ambulatory referral to Podiatry   Encounter for immunization       Relevant Orders   Flu Vaccine Trivalent High Dose (Fluad) (Completed)   Pes cavus of both feet       Relevant Orders   Ambulatory referral to Podiatry       Assessment and Plan    Skin Lesions Multiple suspicious skin lesions on the face. History of sun exposure and previous skin surgery. -Referral to Dermatology for evaluation and possible treatment.  L Forefoot Pain Neuropathy Pain and pressure in the second and third toes for three months. Numbness in the tips of the toes. No known injury. -Referral to Podiatry for evaluation and possible custom insole. -Start Gabapentin for nerve pain. -Consider over-the-counter callous cushions or offloading pads for symptom relief.  Flu Shot  Upcoming oral surgery       Orders Placed This Encounter  Procedures   Flu Vaccine Trivalent High Dose (Fluad)   Ambulatory referral to Dermatology    Referral Priority:   Routine    Referral Type:   Consultation    Referral Reason:   Specialty Services Required    Requested Specialty:    Dermatology    Number of Visits Requested:   1   Ambulatory referral to Podiatry    Referral Priority:   Routine    Referral Type:   Consultation  Referral Reason:   Specialty Services Required    Requested Specialty:   Podiatry    Number of Visits Requested:   1     Meds ordered this encounter  Medications   gabapentin (NEURONTIN) 100 MG capsule    Sig: Start 1 capsule daily, increase by 1 cap every 2-3 days as tolerated up to 3 times a day, or may take 3 at once in evening.    Dispense:  90 capsule    Refill:  1      Follow up plan: Return if symptoms worsen or fail to improve.   Saralyn Pilar, DO [facility] 04/04/2023, 11:45 AM

## 2023-04-04 NOTE — Patient Instructions (Addendum)
Thank you for coming to the office today.  Referral to Dermatologist for the skin spots on face. They may just freeze these with cryotherapy. It will be [facility] or [place] location. I unsure precisely which location. It will be first available.  For Foot pain  It sounds like nerve compression in the Left foot  This can happen with high arches repetitive strain and pressure on this part of the foot.  Start Gabapentin 100mg  capsules, take at night for 2-3 nights only, and then increase to 2 times a day for a few days, and then may increase to 3 times a day, it may make you drowsy, if helps significantly at night only, then you can increase instead to 3 capsules at night, instead of 3 times a day - In the future if needed, we can significantly increase the dose if tolerated well, some common doses are 300mg  three times a day up to 600mg  three times a day, usually it takes several weeks or months to get to higher doses  OTC Alpha Lipoic Acid nerve supplement 600mg  up to 3 times per day  [facility] Address: [address] [place], [place] [address] Hours: Open 8AM-5PM Phone: [phone]  Please schedule a Follow-up Appointment to: Return if symptoms worsen or fail to improve.  If you have any other questions or concerns, please feel free to call the office or send a message through MyChart. You may also schedule an earlier appointment if necessary.  Additionally, you may be receiving a survey about your experience at our office within a few days to 1 week by e-mail or mail. We value your feedback.  Saralyn Pilar, DO [facility] [place]

## 2023-04-22 ENCOUNTER — Ambulatory Visit (INDEPENDENT_AMBULATORY_CARE_PROVIDER_SITE_OTHER): Payer: Medicare Other | Admitting: Podiatry

## 2023-04-22 ENCOUNTER — Encounter: Payer: Self-pay | Admitting: Podiatry

## 2023-04-22 VITALS — Ht 72.0 in | Wt 193.0 lb

## 2023-04-22 DIAGNOSIS — G5762 Lesion of plantar nerve, left lower limb: Secondary | ICD-10-CM

## 2023-04-22 NOTE — Progress Notes (Signed)
  Subjective:  Patient ID: Rodney Cummings, male    DOB: [birth year],  MRN: [id number]  Chief Complaint  Patient presents with   Foot Pain    Pt states left foot pain that's been going on for 3-4 months with no relief.    72 y.o. male presents with the above complaint.  Patient presents with primary complaint of Lex left second interspace pain.  Patient states been going for quite some time is progressive gotten worse with pain between the toes.  Is been going for 3 to 4 months no relief.  He wanted to discuss treatment options for this.  Hurts with ambulation worse with pressure he does a lot of work on his feet.   Review of Systems: Negative except as noted in the HPI. Denies N/V/F/Ch.  Past Medical History:  Diagnosis Date   Elevated PSA    GERD (gastroesophageal reflux disease)    Hearing loss     Current Outpatient Medications:    acetic acid-hydrocortisone (VOSOL-HC) OTIC solution, Place 3 drops into both ears 3 (three) times daily., Disp: 10 mL, Rfl: 2   Cholecalciferol (VITAMIN D3) 1000 units CAPS, Take 5,000 Units by mouth. , Disp: , Rfl:    gabapentin (NEURONTIN) 100 MG capsule, Start 1 capsule daily, increase by 1 cap every 2-3 days as tolerated up to 3 times a day, or may take 3 at once in evening., Disp: 90 capsule, Rfl: 1   Multiple Vitamin (MULTI-VITAMINS) TABS, Take by mouth., Disp: , Rfl:    omeprazole (PRILOSEC) 40 MG capsule, TAKE 1 CAPSULE DAILY, Disp: 90 capsule, Rfl: 3   silodosin (RAPAFLO) 8 MG CAPS capsule, Take 1 capsule (8 mg total) by mouth daily with breakfast., Disp: 90 capsule, Rfl: 3   tadalafil (CIALIS) 5 MG tablet, daily, Disp: 90 tablet, Rfl: 3   vitamin E 180 MG (400 UNITS) capsule, Take by mouth., Disp: , Rfl:   Social History   Tobacco Use  Smoking Status Never  Smokeless Tobacco Current   Types: Chew    No Known Allergies Objective:  There were no vitals filed for this visit. Body mass index is 26.18 kg/m. Constitutional Well  developed. Well nourished.  Vascular Dorsalis pedis pulses palpable bilaterally. Posterior tibial pulses palpable bilaterally. Capillary refill normal to all digits.  No cyanosis or clubbing noted. Pedal hair growth normal.  Neurologic Normal speech. Oriented to person, place, and time. Epicritic sensation to light touch grossly present bilaterally.  Dermatologic Nails well groomed and normal in appearance. No open wounds. No skin lesions.  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  Pain with lateral squeeze test.  Negative capsulitis noted   Radiographs: None Assessment:   1. Neuroma of second interspace of left foot    Plan:  Patient was evaluated and treated and all questions answered.  Left second interspace neuroma -All questions and concerns were discussed with the patient in extensive detail given the amount of pain that he is having he will benefit from a steroid injection help decrease inflammatory component associate with pain.  Patient agrees with provide to proceed with steroid injection -A steroid injection was performed at left second interspace using 1% plain Lidocaine and 10 mg of Kenalog. This was well tolerated. -*Discussed shoe gear modification and insoles  No follow-ups on file.

## 2023-04-24 ENCOUNTER — Encounter: Payer: Self-pay | Admitting: Podiatry

## 2023-05-22 ENCOUNTER — Ambulatory Visit (INDEPENDENT_AMBULATORY_CARE_PROVIDER_SITE_OTHER): Payer: Medicare Other | Admitting: Podiatry

## 2023-05-22 ENCOUNTER — Encounter: Payer: Self-pay | Admitting: Podiatry

## 2023-05-22 VITALS — Ht 72.0 in | Wt 193.0 lb

## 2023-05-22 DIAGNOSIS — R200 Anesthesia of skin: Secondary | ICD-10-CM

## 2023-05-22 DIAGNOSIS — G5762 Lesion of plantar nerve, left lower limb: Secondary | ICD-10-CM | POA: Diagnosis not present

## 2023-05-22 DIAGNOSIS — R202 Paresthesia of skin: Secondary | ICD-10-CM

## 2023-05-22 NOTE — Progress Notes (Signed)
  Subjective:  Patient ID: Rodney Cummings, male    DOB: 03-04-1951,  MRN: [id number]  Chief Complaint  Patient presents with   Foot Pain    Pt is here to f/u up on left foot pain states he got the shoes that were recommend and his has been feeling a lot better.    72 y.o. male presents with the above complaint.  Patient presents with primary complaint of Lex left second interspace pain.  Patient states been going for quite some time is progressive gotten worse with pain between the toes.  Is been going for 3 to 4 months no relief.  He wanted to discuss treatment options for this.  Hurts with ambulation worse with pressure he does a lot of work on his feet.   Review of Systems: Negative except as noted in the HPI. Denies N/V/F/Ch.  Past Medical History:  Diagnosis Date   Elevated PSA    GERD (gastroesophageal reflux disease)    Hearing loss     Current Outpatient Medications:    acetic acid-hydrocortisone (VOSOL-HC) OTIC solution, Place 3 drops into both ears 3 (three) times daily., Disp: 10 mL, Rfl: 2   Cholecalciferol (VITAMIN D3) 1000 units CAPS, Take 5,000 Units by mouth. , Disp: , Rfl:    Multiple Vitamin (MULTI-VITAMINS) TABS, Take by mouth., Disp: , Rfl:    omeprazole (PRILOSEC) 40 MG capsule, TAKE 1 CAPSULE DAILY, Disp: 90 capsule, Rfl: 3   silodosin (RAPAFLO) 8 MG CAPS capsule, Take 1 capsule (8 mg total) by mouth daily with breakfast., Disp: 90 capsule, Rfl: 3   tadalafil (CIALIS) 5 MG tablet, daily, Disp: 90 tablet, Rfl: 3   vitamin E 180 MG (400 UNITS) capsule, Take by mouth., Disp: , Rfl:   Social History   Tobacco Use  Smoking Status Never  Smokeless Tobacco Current   Types: Chew    No Known Allergies Objective:  There were no vitals filed for this visit. Body mass index is 26.18 kg/m. Constitutional Well developed. Well nourished.  Vascular Dorsalis pedis pulses palpable bilaterally. Posterior tibial pulses palpable bilaterally. Capillary refill normal to  all digits.  No cyanosis or clubbing noted. Pedal hair growth normal.  Neurologic Normal speech. Oriented to person, place, and time. Epicritic sensation to light touch grossly present bilaterally.  Dermatologic Nails well groomed and normal in appearance. No open wounds. No skin lesions.  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  Pain with lateral squeeze test.  Negative capsulitis noted   Radiographs: None Assessment:   1. Neuroma of second interspace of left foot   2. Numbness and tingling     Plan:  Patient was evaluated and treated and all questions answered.  Left second interspace neuroma with underlying numbness tingling to all digits -All questions and concerns were discussed with the patient in extensive detail given the amount of pain that he is having he will benefit from a steroid injection help decrease inflammatory component associate with pain.  Patient agrees with provide to proceed with steroid injection -A second steroid injection was performed at left second interspace using 1% plain Lidocaine and 10 mg of Kenalog. This was well tolerated. -*Discussed shoe gear modification and insoles -I discussed that this likely could be coming back from the lower back with lower back pain.  He states understanding and he will see a back specialist  No follow-ups on file.

## 2023-05-26 DIAGNOSIS — H43393 Other vitreous opacities, bilateral: Secondary | ICD-10-CM | POA: Diagnosis not present

## 2023-05-26 DIAGNOSIS — H26493 Other secondary cataract, bilateral: Secondary | ICD-10-CM | POA: Diagnosis not present

## 2023-05-26 DIAGNOSIS — H04123 Dry eye syndrome of bilateral lacrimal glands: Secondary | ICD-10-CM | POA: Diagnosis not present

## 2023-12-03 ENCOUNTER — Ambulatory Visit (INDEPENDENT_AMBULATORY_CARE_PROVIDER_SITE_OTHER): Payer: Medicare Other | Admitting: Dermatology

## 2023-12-03 ENCOUNTER — Encounter: Payer: Self-pay | Admitting: Dermatology

## 2023-12-03 ENCOUNTER — Other Ambulatory Visit: Payer: Self-pay | Admitting: Family Medicine

## 2023-12-03 DIAGNOSIS — W908XXA Exposure to other nonionizing radiation, initial encounter: Secondary | ICD-10-CM

## 2023-12-03 DIAGNOSIS — L570 Actinic keratosis: Secondary | ICD-10-CM | POA: Diagnosis not present

## 2023-12-03 DIAGNOSIS — Z1283 Encounter for screening for malignant neoplasm of skin: Secondary | ICD-10-CM

## 2023-12-03 DIAGNOSIS — L814 Other melanin hyperpigmentation: Secondary | ICD-10-CM | POA: Diagnosis not present

## 2023-12-03 DIAGNOSIS — I781 Nevus, non-neoplastic: Secondary | ICD-10-CM | POA: Diagnosis not present

## 2023-12-03 DIAGNOSIS — Z85828 Personal history of other malignant neoplasm of skin: Secondary | ICD-10-CM | POA: Diagnosis not present

## 2023-12-03 DIAGNOSIS — D1801 Hemangioma of skin and subcutaneous tissue: Secondary | ICD-10-CM | POA: Diagnosis not present

## 2023-12-03 DIAGNOSIS — L821 Other seborrheic keratosis: Secondary | ICD-10-CM

## 2023-12-03 DIAGNOSIS — L820 Inflamed seborrheic keratosis: Secondary | ICD-10-CM | POA: Diagnosis not present

## 2023-12-03 DIAGNOSIS — D229 Melanocytic nevi, unspecified: Secondary | ICD-10-CM

## 2023-12-03 DIAGNOSIS — D692 Other nonthrombocytopenic purpura: Secondary | ICD-10-CM | POA: Diagnosis not present

## 2023-12-03 DIAGNOSIS — K2100 Gastro-esophageal reflux disease with esophagitis, without bleeding: Secondary | ICD-10-CM

## 2023-12-03 DIAGNOSIS — L578 Other skin changes due to chronic exposure to nonionizing radiation: Secondary | ICD-10-CM | POA: Diagnosis not present

## 2023-12-03 NOTE — Patient Instructions (Addendum)
 If notice any of spots treated today are not gone in 2 to 3 months give us  a call or send mychart can recheck areas may need additional treatment   Actinic keratoses are precancerous spots that appear secondary to cumulative UV radiation exposure/sun exposure over time. They are chronic with expected duration over 1 year. A portion of actinic keratoses will progress to squamous cell carcinoma of the skin. It is not possible to reliably predict which spots will progress to skin cancer and so treatment is recommended to prevent development of skin cancer.  Recommend daily broad spectrum sunscreen SPF 30+ to sun-exposed areas, reapply every 2 hours as needed.  Recommend staying in the shade or wearing long sleeves, sun glasses (UVA+UVB protection) and wide brim hats (4-inch brim around the entire circumference of the hat). Call for new or changing lesions.   Cryotherapy Aftercare  Wash gently with soap and water everyday.   Apply Vaseline and Band-Aid daily until healed.    Seborrheic Keratosis  What causes seborrheic keratoses? Seborrheic keratoses are harmless, common skin growths that first appear during adult life.  As time goes by, more growths appear.  Some people may develop a large number of them.  Seborrheic keratoses appear on both covered and uncovered body parts.  They are not caused by sunlight.  The tendency to develop seborrheic keratoses can be inherited.  They vary in color from skin-colored to gray, brown, or even black.  They can be either smooth or have a rough, warty surface.   Seborrheic keratoses are superficial and look as if they were stuck on the skin.  Under the microscope this type of keratosis looks like layers upon layers of skin.  That is why at times the top layer may seem to fall off, but the rest of the growth remains and re-grows.    Treatment Seborrheic keratoses do not need to be treated, but can easily be removed in the office.  Seborrheic keratoses often cause  symptoms when they rub on clothing or jewelry.  Lesions can be in the way of shaving.  If they become inflamed, they can cause itching, soreness, or burning.  Removal of a seborrheic keratosis can be accomplished by freezing, burning, or surgery. If any spot bleeds, scabs, or grows rapidly, please return to have it checked, as these can be an indication of a skin cancer.   Due to recent changes in healthcare laws, you may see results of your pathology and/or laboratory studies on MyChart before the doctors have had a chance to review them. We understand that in some cases there may be results that are confusing or concerning to you. Please understand that not all results are received at the same time and often the doctors may need to interpret multiple results in order to provide you with the best plan of care or course of treatment. Therefore, we ask that you please give us  2 business days to thoroughly review all your results before contacting the office for clarification. Should we see a critical lab result, you will be contacted sooner.   If You Need Anything After Your Visit  If you have any questions or concerns for your doctor, please call our main line at [phone] and press option 4 to reach your doctor's medical assistant. If no one answers, please leave a voicemail as directed and we will return your call as soon as possible. Messages left after 4 pm will be answered the following business day.   You may  also send us  a message via MyChart. We typically respond to MyChart messages within 1-2 business days.  For prescription refills, please ask your pharmacy to contact our office. Our fax number is [phone].  If you have an urgent issue when the clinic is closed that cannot wait until the next business day, you can page your doctor at the number below.    Please note that while we do our best to be available for urgent issues outside of office hours, we are not available 24/7.   If  you have an urgent issue and are unable to reach us , you may choose to seek medical care at your doctor's office, retail clinic, urgent care center, or emergency room.  If you have a medical emergency, please immediately call 911 or go to the emergency department.  Pager Numbers  - Dr. Hester: [phone]  - Dr. Jackquline: [phone]  - Dr. Claudene: [phone]   In the event of inclement weather, please call our main line at [phone] for an update on the status of any delays or closures.  Dermatology Medication Tips: Please keep the boxes that topical medications come in in order to help keep track of the instructions about where and how to use these. Pharmacies typically print the medication instructions only on the boxes and not directly on the medication tubes.   If your medication is too expensive, please contact our office at [phone] option 4 or send us  a message through MyChart.   We are unable to tell what your co-pay for medications will be in advance as this is different depending on your insurance coverage. However, we may be able to find a substitute medication at lower cost or fill out paperwork to get insurance to cover a needed medication.   If a prior authorization is required to get your medication covered by your insurance company, please allow us  1-2 business days to complete this process.  Drug prices often vary depending on where the prescription is filled and some pharmacies may offer cheaper prices.  The website www.goodrx.com contains coupons for medications through different pharmacies. The prices here do not account for what the cost may be with help from insurance (it may be cheaper with your insurance), but the website can give you the price if you did not use any insurance.  - You can print the associated coupon and take it with your prescription to the pharmacy.  - You may also stop by our office during regular business hours and pick up a GoodRx  coupon card.  - If you need your prescription sent electronically to a different pharmacy, notify our office through [facility] or by phone at [phone] option 4.     Si Usted Necesita Algo Despus de Su Visita  Tambin puede enviarnos un mensaje a travs de Clinical cytogeneticist. Por lo general respondemos a los mensajes de MyChart en el transcurso de 1 a 2 das hbiles.  Para renovar recetas, por favor pida a su farmacia que se ponga en contacto con nuestra oficina. Randi lakes de fax es [place] [phone].  Si tiene un asunto urgente cuando la clnica est cerrada y que no puede esperar hasta el siguiente da hbil, puede llamar/localizar a su doctor(a) al nmero que aparece a continuacin.   Por favor, tenga en cuenta que aunque hacemos todo lo posible para estar disponibles para asuntos urgentes fuera del horario de [place], no estamos disponibles las 24 horas del da, los 7 [address]  [address] de la [place].  Si tiene un problema urgente y no puede comunicarse con nosotros, puede optar por buscar atencin mdica  en el consultorio de su doctor(a), en una clnica privada, en un centro de atencin urgente o en una sala de emergencias.  Si tiene Engineer, drilling, por favor llame inmediatamente al 911 o vaya a la sala de emergencias.  Nmeros de bper  - Dr. Hester: [phone]  - Dra. Jackquline: [phone]  - Dr. Claudene: [phone]   En caso de inclemencias del tiempo, por favor llame a landry capes principal al [phone] para una actualizacin sobre el [place] de cualquier retraso o cierre.  Consejos para la medicacin en dermatologa: Por favor, guarde las cajas en las que vienen los medicamentos de uso tpico para ayudarle a seguir las instrucciones sobre dnde y cmo usarlos. Las farmacias generalmente imprimen las instrucciones del medicamento slo en las cajas y no directamente en los tubos del [place].   Si su medicamento es muy caro, por favor, pngase en contacto con  landry rieger llamando al [phone] y presione la opcin 4 o envenos un mensaje a travs de Clinical cytogeneticist.   No podemos decirle cul ser su copago por los medicamentos por adelantado ya que esto es diferente dependiendo de la cobertura de su seguro. Sin embargo, es posible que podamos encontrar un medicamento sustituto a Audiological scientist un formulario para que el seguro cubra el medicamento que se considera necesario.   Si se requiere una autorizacin previa para que su compaa de seguros malta su medicamento, por favor permtanos de 1 a 2 das hbiles para completar este proceso.  Los precios de los medicamentos varan con frecuencia dependiendo del Environmental consultant de dnde se surte la receta y alguna farmacias pueden ofrecer precios ms baratos.  El sitio web www.goodrx.com tiene cupones para medicamentos de Health and safety inspector. Los precios aqu no tienen en cuenta lo que podra costar con la ayuda del seguro (puede ser ms barato con su seguro), pero el sitio web puede darle el precio si no utiliz Tourist information centre manager.  - Puede imprimir el cupn correspondiente y llevarlo con su receta a la farmacia.  - Tambin puede pasar por nuestra oficina durante el horario de atencin regular y Education officer, museum una tarjeta de cupones de GoodRx.  - Si necesita que su receta se enve electrnicamente a una farmacia diferente, informe a nuestra oficina a travs de MyChart de [place] o por telfono llamando al [phone] y presione la opcin 4.

## 2023-12-03 NOTE — Progress Notes (Signed)
 New Patient Visit   Subjective  Rodney Cummings is a 73 y.o. male who presents for the following: Skin Cancer Screening and Full Body Skin Exam patient here today concerning some spots he noticed a year ago on his right cheek, right nose, right and left preauricular. Patient reports he had history of skin cancer removed 4 years ago by Dr. Myer Gaskins in [place] at right preauricular area and a spot removed by Dr. Arlyss on right deltoid he says was skin cancer.   The patient presents for Total-Body Skin Exam (TBSE) for skin cancer screening and mole check. The patient has spots, moles and lesions to be evaluated, some may be new or changing and the patient may have concern these could be cancer.  The following portions of the chart were reviewed this encounter and updated as appropriate: medications, allergies, medical history  Review of Systems:  No other skin or systemic complaints except as noted in HPI or Assessment and Plan.  Objective  Well appearing patient in no apparent distress; mood and affect are within normal limits.  A full examination was performed including scalp, head, eyes, ears, nose, lips, neck, chest, axillae, abdomen, back, buttocks, bilateral upper extremities, bilateral lower extremities, hands, feet, fingers, toes, fingernails, and toenails. All findings within normal limits unless otherwise noted below.   Relevant physical exam findings are noted in the Assessment and Plan.  right nose x 1, right zygoma x 2, right proximal mandible x 1, left ear x 1, left popliteal x 1 (5) Erythematous thin papules/macules with gritty scale.  left temple and left brow x 4, b/l arms x 21, back x 7 (32) Erythematous stuck-on, waxy papule or plaque  Assessment & Plan   SKIN CANCER SCREENING PERFORMED TODAY.  ACTINIC DAMAGE - Chronic condition, secondary to cumulative UV/sun exposure - diffuse scaly erythematous macules with underlying dyspigmentation - Recommend daily broad  spectrum sunscreen SPF 30+ to sun-exposed areas, reapply every 2 hours as needed.  - Staying in the shade or wearing long sleeves, sun glasses (UVA+UVB protection) and wide brim hats (4-inch brim around the entire circumference of the hat) are also recommended for sun protection.  - Call for new or changing lesions.  LENTIGINES, SEBORRHEIC KERATOSES, HEMANGIOMAS - Benign normal skin lesions - Benign-appearing - Call for any changes  MELANOCYTIC NEVI - Tan-brown and/or pink-flesh-colored symmetric macules and papules - Benign appearing on exam today - Observation - Call clinic for new or changing moles - Recommend daily use of broad spectrum spf 30+ sunscreen to sun-exposed areas.   Purpura - Chronic; persistent and recurrent.  Treatable, but not curable. - Violaceous macules and patches - Benign - Related to trauma, age, sun damage and/or use of blood thinners, chronic use of topical and/or oral steroids - Observe - Can use OTC arnica containing moisturizer such as Dermend Bruise Formula if desired - Call for worsening or other concerns  TELANGIECTASIA Exam: dilated blood vessel(s) Treatment Plan: Benign appearing on exam Call for changes  HISTORY OF SKIN CANCER Patient reports history of skin cancer on right deltoid treated by Dr. Arlyss and area at right preauricular area treated by Dr. Gaskins in [place] 4 years ago Information pending record release form  - Clear. Observe for recurrence.  - Call clinic for new or changing lesions.   - Recommend regular skin exams, daily broad-spectrum spf 30+ sunscreen use, and photoprotection.     ACTINIC KERATOSIS (5) right nose x 1, right zygoma x 2,  right proximal mandible x 1, left ear x 1, left popliteal x 1 (5) Actinic keratoses are precancerous spots that appear secondary to cumulative UV radiation exposure/sun exposure over time. They are chronic with expected duration over 1 year. A portion of actinic keratoses will progress to  squamous cell carcinoma of the skin. It is not possible to reliably predict which spots will progress to skin cancer and so treatment is recommended to prevent development of skin cancer. Recommend daily broad spectrum sunscreen SPF 30+ to sun-exposed areas, reapply every 2 hours as needed.  Recommend staying in the shade or wearing long sleeves, sun glasses (UVA+UVB protection) and wide brim hats (4-inch brim around the entire circumference of the hat). Call for new or changing lesions.   INFLAMED SEBORRHEIC KERATOSIS (32) left temple and left brow x 4, b/l arms x 21, back x 7 (32) Symptomatic, irritating, patient would like treated. Destruction of lesion - left temple and left brow x 4, b/l arms x 21, back x 7 (32) Complexity: simple   Destruction method: cryotherapy   Informed consent: discussed and consent obtained   Timeout:  patient name, date of birth, surgical site, and procedure verified Lesion destroyed using liquid nitrogen: Yes   Region frozen until ice ball extended beyond lesion: Yes   Outcome: patient tolerated procedure well with no complications   Post-procedure details: wound care instructions given    Return in about 8 months (around 08/04/2024) for ak follow up.  IEleanor Blush, CMA, am acting as scribe for Alm Rhyme, MD.   Documentation: I have reviewed the above documentation for accuracy and completeness, and I agree with the above.  Alm Rhyme, MD

## 2023-12-04 NOTE — Telephone Encounter (Signed)
 Requested Prescriptions  Pending Prescriptions Disp Refills   omeprazole  (PRILOSEC) 40 MG capsule [Pharmacy Med Name: OMEPRAZOLE  DR CAPS 40MG ] 90 capsule 0    Sig: TAKE 1 CAPSULE DAILY     Gastroenterology: Proton Pump Inhibitors Failed - 12/04/2023  2:19 PM      Failed - Valid encounter within last 12 months    Recent Outpatient Visits   None     Future Appointments             In 1 month Stoioff, Glendia BROCKS, MD [facility] [place]   In 7 months Hester Alm BROCKS, MD [facility]

## 2024-01-16 ENCOUNTER — Ambulatory Visit: Payer: Medicare Other

## 2024-01-16 ENCOUNTER — Other Ambulatory Visit: Payer: Self-pay | Admitting: Family Medicine

## 2024-01-16 DIAGNOSIS — H60543 Acute eczematoid otitis externa, bilateral: Secondary | ICD-10-CM

## 2024-01-16 DIAGNOSIS — Z0000 Encounter for general adult medical examination without abnormal findings: Secondary | ICD-10-CM

## 2024-01-16 MED ORDER — HYDROCORTISONE-ACETIC ACID 1-2 % OT SOLN: 3.0000 [drp] | Freq: Three times a day (TID) | OTIC | 2 refills | Status: DC

## 2024-01-16 NOTE — Progress Notes (Signed)
 Subjective:   Rodney Cummings is a 73 y.o. who presents for a Medicare Wellness preventive visit.  As a reminder, Annual Wellness Visits don't include a physical exam, and some assessments may be limited, especially if this visit is performed virtually. We may recommend an in-person follow-up visit with your provider if needed.  Visit Complete: Virtual I connected with  [name] on 01/16/24 by a audio enabled telemedicine application and verified that I am speaking with the correct person using two identifiers.  Patient Location: Home  Provider Location: Home Office  I discussed the limitations of evaluation and management by telemedicine. The patient expressed understanding and agreed to proceed.  Vital Signs: Because this visit was a virtual/telehealth visit, some criteria may be missing or patient reported. Any vitals not documented were not able to be obtained and vitals that have been documented are patient reported.  VideoDeclined- This patient declined Librarian, academic. Therefore the visit was completed with audio only.  Persons Participating in Visit: Patient.  AWV Questionnaire: No: Patient Medicare AWV questionnaire was not completed prior to this visit.  Cardiac Risk Factors include: advanced age (>60men, >70 women);dyslipidemia;male gender     Objective:    There were no vitals filed for this visit. There is no height or weight on file to calculate BMI.     01/16/2024    1:29 PM 01/10/2023    1:35 PM 01/04/2022    1:29 PM 11/07/2020    1:24 PM 11/02/2019    1:27 PM  Advanced Directives  Does Patient Have a Medical Advance Directive? No No No Yes Yes  Type of Theme park manager;Living will Living will;Healthcare Power of Attorney  Copy of Healthcare Power of Attorney in Chart?    No - copy requested No - copy requested  Would patient like information on creating a medical advance directive? No - Patient  declined No - Patient declined No - Patient declined      Current Medications (verified) Outpatient Encounter Medications as of 01/16/2024  Medication Sig   acetic acid -hydrocortisone  (VOSOL -HC) OTIC solution Place 3 drops into both ears 3 (three) times daily.   Cholecalciferol (VITAMIN D3) 1000 units CAPS Take 5,000 Units by mouth.    Multiple Vitamin (MULTI-VITAMINS) TABS Take by mouth.   omeprazole  (PRILOSEC) 40 MG capsule TAKE 1 CAPSULE DAILY   silodosin  (RAPAFLO ) 8 MG CAPS capsule Take 1 capsule (8 mg total) by mouth daily with breakfast.   tadalafil  (CIALIS ) 5 MG tablet daily   vitamin E 180 MG (400 UNITS) capsule Take by mouth.   No facility-administered encounter medications on file as of 01/16/2024.    Allergies (verified) Patient has no known allergies.   History: Past Medical History:  Diagnosis Date   Elevated PSA    GERD (gastroesophageal reflux disease)    Hearing loss    Past Surgical History:  Procedure Laterality Date   SQUAMOUS CELL CARCINOMA EXCISION Right 2020   arm    TONSILLECTOMY     age 24   Family History  Problem Relation Age of Onset   Dementia Mother    Cancer Father        throat/stomach   Dementia Maternal Aunt    Cancer Paternal Grandmother    Cancer Paternal Grandfather    Dementia Maternal Uncle    Social History   Socioeconomic History   Marital status: Married    Spouse name: Not on file  Number of children: Not on file   Years of education: Not on file   Highest education level: Not on file  Occupational History   Occupation: retired  Tobacco Use   Smoking status: Never   Smokeless tobacco: Current    Types: Chew  Vaping Use   Vaping status: Never Used  Substance and Sexual Activity   Alcohol use: Yes    Alcohol/week: 0.0 standard drinks of alcohol    Comment: beer on weekends    Drug use: No   Sexual activity: Yes  Other Topics Concern   Not on file  Social History Narrative   Not on file   Social Drivers of  Health   Financial Resource Strain: Low Risk  (01/16/2024)   Overall Financial Resource Strain (CARDIA)    Difficulty of Paying Living Expenses: Not hard at all  Food Insecurity: No Food Insecurity (01/16/2024)   Hunger Vital Sign    Worried About Running Out of Food in the Last Year: Never true    Ran Out of Food in the Last Year: Never true  Transportation Needs: No Transportation Needs (01/16/2024)   PRAPARE - Administrator, Civil Service (Medical): No    Lack of Transportation (Non-Medical): No  Physical Activity: Sufficiently Active (01/16/2024)   Exercise Vital Sign    Days of Exercise per Week: 5 days    Minutes of Exercise per Session: 60 min  Recent Concern: Physical Activity - Inactive (01/13/2024)   Exercise Vital Sign    Days of Exercise per Week: 0 days    Minutes of Exercise per Session: Not on file  Stress: No Stress Concern Present (01/16/2024)   [employer] of Occupational Health - Occupational Stress Questionnaire    Feeling of Stress: Not at all  Social Connections: Socially Integrated (01/16/2024)   Social Connection and Isolation Panel    Frequency of Communication with Friends and Family: Twice a week    Frequency of Social Gatherings with Friends and Family: Twice a week    Attends Religious Services: More than 4 times per year    Active Member of [employer] or Organizations: Yes    Attends Engineer, structural: More than 4 times per year    Marital Status: Married    Tobacco Counseling Ready to quit: Not Answered Counseling given: Not Answered    Clinical Intake:  Pre-visit preparation completed: Yes  Pain : No/denies pain     BMI - recorded: 26.2 Nutritional Status: BMI 25 -29 Overweight Nutritional Risks: None Diabetes: No  Lab Results  Component Value Date   HGBA1C 4.8 03/13/2020   HGBA1C 4.9 03/10/2019   HGBA1C 4.9 03/23/2018     How often do you need to have someone help you when you read instructions, pamphlets, or  other written materials from your doctor or pharmacy?: 1 - Never  Interpreter Needed?: No  Information entered by :: JHONNIE DAS, LPN   Activities of Daily Living     01/16/2024    1:31 PM  In your present state of health, do you have any difficulty performing the following activities:  Hearing? 1  Vision? 0  Difficulty concentrating or making decisions? 0  Walking or climbing stairs? 0  Dressing or bathing? 0  Doing errands, shopping? 0  Preparing Food and eating ? N  Using the Toilet? N  In the past six months, have you accidently leaked urine? N  Do you have problems with loss of bowel control? N  Managing  your Medications? N  Managing your Finances? N  Housekeeping or managing your Housekeeping? N    Patient Care Team: Edman Marsa PARAS, DO as PCP - [facility], [facility] \ I have updated your Care Teams any recent Medical Services you may have received from other providers in the past year.     Assessment:   This is a routine wellness examination for [place].  Hearing/Vision screen Hearing Screening - Comments:: WEARS AIDS, BOTH EARS Vision Screening - Comments:: READERS-WOODARD- SEEN IN APRIL   Goals Addressed             This Visit's Progress    Cut out extra servings         Depression Screen     01/16/2024    1:28 PM 01/10/2023    1:33 PM 05/24/2022    2:13 PM 01/04/2022    1:28 PM 12/03/2021    9:32 AM 11/07/2020    1:25 PM 03/16/2020    9:16 AM  PHQ 2/9 Scores  PHQ - 2 Score 0 0 3 1 3  0 0  PHQ- 9 Score 0 0 10 3 7       Fall Risk     01/16/2024    1:31 PM 01/10/2023    1:37 PM 01/06/2023   11:12 AM 05/24/2022    2:13 PM 01/04/2022    1:30 PM  Fall Risk   Falls in the past year? 0 0 0 0 0  Number falls in past yr: 0 0 0 0 0  Injury with Fall? 0 0 0 0 0  Risk for fall due to : No Fall Risks No Fall Risks  No Fall Risks No Fall Risks  Follow up Falls evaluation completed;Falls prevention discussed Falls  prevention discussed;Falls evaluation completed  Falls evaluation completed  Falls evaluation completed      Data saved with a previous flowsheet row definition    MEDICARE RISK AT HOME:  Medicare Risk at Home Any stairs in or around the home?: Yes If so, are there any without handrails?: No Home free of loose throw rugs in walkways, pet beds, electrical cords, etc?: Yes Adequate lighting in your home to reduce risk of falls?: Yes Life alert?: No Use of a cane, walker or w/c?: No Grab bars in the bathroom?: No Shower chair or bench in shower?: No Elevated toilet seat or a handicapped toilet?: No  TIMED UP AND GO:  Was the test performed?  No  Cognitive Function: 6CIT completed        01/16/2024    1:33 PM 01/10/2023    1:38 PM 11/07/2020    1:28 PM  6CIT Screen  What Year? 0 points 0 points 0 points  What month? 0 points 0 points 0 points  What time? 0 points 0 points 0 points  Count back from 20 0 points 0 points 0 points  Months in reverse 4 points 4 points 4 points  Repeat phrase 0 points 0 points 2 points  Total Score 4 points 4 points 6 points    Immunizations Immunization History  Administered Date(s) Administered   Fluad Quad(high Dose 65+) 03/16/2019, 03/16/2020   Fluad Trivalent(High Dose 65+) 04/04/2023   Influenza, High Dose Seasonal PF 04/23/2016, 04/17/2017, 03/18/2018   PFIZER(Purple Top)SARS-COV-2 Vaccination 07/12/2019, 07/18/2019, 08/01/2019, 08/08/2019   Pneumococcal Conjugate-13 10/17/2015   Tdap 10/17/2015    Screening Tests Health Maintenance  Topic Date Due   Zoster Vaccines- Shingrix (1 of 2) Never done  Pneumococcal Vaccine: 50+ Years (2 of 2 - PCV20 or PCV21) 10/16/2016   COVID-19 Vaccine (5 - 2024-25 season) 02/09/2023   Colonoscopy  10/28/2023   INFLUENZA VACCINE  01/09/2024   Medicare Annual Wellness (AWV)  01/15/2025   DTaP/Tdap/Td (2 - Td or Tdap) 10/16/2025   Hepatitis C Screening  Completed   Hepatitis B Vaccines  Aged Out    HPV VACCINES  Aged Out   Meningococcal B Vaccine  Aged Out    Health Maintenance  Health Maintenance Due  Topic Date Due   Zoster Vaccines- Shingrix (1 of 2) Never done   Pneumococcal Vaccine: 50+ Years (2 of 2 - PCV20 or PCV21) 10/16/2016   COVID-19 Vaccine (5 - 2024-25 season) 02/09/2023   Colonoscopy  10/28/2023   INFLUENZA VACCINE  01/09/2024   Health Maintenance Items Addressed: DECLINES COLONOSCOPY REFERRAL; NEEDS PNA- STATES HAD SHINGRIX, UP TO DATE ON TDAP, WANTS NO MORE COVIDS  Additional Screening:  Vision Screening: Recommended annual ophthalmology exams for early detection of glaucoma and other disorders of the eye. Would you like a referral to an eye doctor? No    Dental Screening: Recommended annual dental exams for proper oral hygiene  Community Resource Referral / Chronic Care Management: CRR required this visit?  No   CCM required this visit?  No   Plan:    I have personally reviewed and noted the following in the patient's chart:   Medical and social history Use of alcohol, tobacco or illicit drugs  Current medications and supplements including opioid prescriptions. Patient is not currently taking opioid prescriptions. Functional ability and status Nutritional status Physical activity Advanced directives List of other physicians Hospitalizations, surgeries, and ER visits in previous 12 months Vitals Screenings to include cognitive, depression, and falls Referrals and appointments  In addition, I have reviewed and discussed with patient certain preventive protocols, quality metrics, and best practice recommendations. A written personalized care plan for preventive services as well as general preventive health recommendations were provided to patient.   Jhonnie GORMAN Das, LPN   06/10/7972   After Visit Summary: (MyChart) Due to this being a telephonic visit, the after visit summary with patients personalized plan was offered to patient via MyChart    Notes: Nothing significant to report at this time.

## 2024-01-16 NOTE — Patient Instructions (Signed)
 [name] , Thank you for taking time out of your busy schedule to complete your Annual Wellness Visit with me. I enjoyed our conversation and look forward to speaking with you again next year. I, as well as your care team,  appreciate your ongoing commitment to your health goals. Please review the following plan we discussed and let me know if I can assist you in the future.   Follow up Visits: 01/28/25 @ 3:20 PM BY PHONE We will see or speak with you next year for your Next Medicare AWV with our clinical staff Have you seen your provider in the last 6 months (3 months if uncontrolled diabetes)? Yes  Clinician Recommendations:  Aim for 30 minutes of exercise or brisk walking, 6-8 glasses of water, and 5 servings of fruits and vegetables each day. TAKE CARE!      This is a list of the screenings recommended for you:  Health Maintenance  Topic Date Due   Zoster (Shingles) Vaccine (1 of 2) Never done   Pneumococcal Vaccine for age over 17 (2 of 2 - PCV20 or PCV21) 10/16/2016   COVID-19 Vaccine (5 - 2024-25 season) 02/09/2023   Colon Cancer Screening  10/28/2023   Flu Shot  01/09/2024   Medicare Annual Wellness Visit  01/15/2025   DTaP/Tdap/Td vaccine (2 - Td or Tdap) 10/16/2025   Hepatitis C Screening  Completed   Hepatitis B Vaccine  Aged Out   HPV Vaccine  Aged Out   Meningitis B Vaccine  Aged Out    Advanced directives: (ACP Link)Information on Advanced Care Planning can be found at [place]  Best boy Advance Health Care Directives Advance Health Care Directives. [email or web address]  Advance Care Planning is important because it:  [x]  Makes sure you receive the medical care that is consistent with your values, goals, and preferences  [x]  It provides guidance to your family and loved ones and reduces their decisional burden about whether or not they are making the right decisions based on your wishes.  Follow the link provided in your after visit summary or read over the paperwork  we have mailed to you to help you started getting your Advance Directives in place. If you need assistance in completing these, please reach out to us  so that we can help you!

## 2024-01-19 ENCOUNTER — Other Ambulatory Visit: Payer: Self-pay

## 2024-01-19 DIAGNOSIS — R972 Elevated prostate specific antigen [PSA]: Secondary | ICD-10-CM | POA: Diagnosis not present

## 2024-01-20 LAB — PSA: Prostate Specific Ag, Serum: 3.7 ng/mL (ref 0.0–4.0)

## 2024-01-23 ENCOUNTER — Ambulatory Visit: Payer: Self-pay | Admitting: Urology

## 2024-01-30 DIAGNOSIS — T1511XA Foreign body in conjunctival sac, right eye, initial encounter: Secondary | ICD-10-CM | POA: Diagnosis not present

## 2024-02-04 ENCOUNTER — Encounter: Payer: Self-pay | Admitting: Dermatology

## 2024-02-04 ENCOUNTER — Ambulatory Visit (INDEPENDENT_AMBULATORY_CARE_PROVIDER_SITE_OTHER): Admitting: Dermatology

## 2024-02-04 DIAGNOSIS — Z7189 Other specified counseling: Secondary | ICD-10-CM

## 2024-02-04 DIAGNOSIS — W908XXA Exposure to other nonionizing radiation, initial encounter: Secondary | ICD-10-CM

## 2024-02-04 DIAGNOSIS — Z5111 Encounter for antineoplastic chemotherapy: Secondary | ICD-10-CM

## 2024-02-04 DIAGNOSIS — Z79899 Other long term (current) drug therapy: Secondary | ICD-10-CM | POA: Diagnosis not present

## 2024-02-04 DIAGNOSIS — L570 Actinic keratosis: Secondary | ICD-10-CM

## 2024-02-04 DIAGNOSIS — L578 Other skin changes due to chronic exposure to nonionizing radiation: Secondary | ICD-10-CM | POA: Diagnosis not present

## 2024-02-04 MED ORDER — FLUOROURACIL 5 % EX CREA: TOPICAL_CREAM | Freq: Two times a day (BID) | CUTANEOUS | 1 refills | Status: AC

## 2024-02-04 NOTE — Patient Instructions (Signed)
 Starting October 1st- Start 5-fluorouracil /calcipotriene cream twice a day for 7 days to affected area at jaw line below right earlobe. Prescription sent to [facility]. Patient advised they will receive an email to purchase the medication online and have it sent to their home. Patient provided with handout reviewing treatment course and side effects and advised to call or message us  on MyChart with any concerns.  Reviewed course of treatment and expected reaction.  Patient advised to expect inflammation and crusting and advised that erosions are possible.  Patient advised to be diligent with sun protection during and after treatment. Counseled to keep medication out of reach of children and pets.     Cryotherapy Aftercare  Wash gently with soap and water everyday.   Apply Vaseline Jelly daily until healed.     Recommend daily broad spectrum sunscreen SPF 30+ to sun-exposed areas, reapply every 2 hours as needed. Call for new or changing lesions.  Staying in the shade or wearing long sleeves, sun glasses (UVA+UVB protection) and wide brim hats (4-inch brim around the entire circumference of the hat) are also recommended for sun protection.     Due to recent changes in healthcare laws, you may see results of your pathology and/or laboratory studies on MyChart before the doctors have had a chance to review them. We understand that in some cases there may be results that are confusing or concerning to you. Please understand that not all results are received at the same time and often the doctors may need to interpret multiple results in order to provide you with the best plan of care or course of treatment. Therefore, we ask that you please give us  2 business days to thoroughly review all your results before contacting the office for clarification. Should we see a critical lab result, you will be contacted sooner.   If You Need Anything After Your Visit  If you have any  questions or concerns for your doctor, please call our main line at [phone] and press option 4 to reach your doctor's medical assistant. If no one answers, please leave a voicemail as directed and we will return your call as soon as possible. Messages left after 4 pm will be answered the following business day.   You may also send us  a message via MyChart. We typically respond to MyChart messages within 1-2 business days.  For prescription refills, please ask your pharmacy to contact our office. Our fax number is [phone].  If you have an urgent issue when the clinic is closed that cannot wait until the next business day, you can page your doctor at the number below.    Please note that while we do our best to be available for urgent issues outside of office hours, we are not available 24/7.   If you have an urgent issue and are unable to reach us , you may choose to seek medical care at your doctor's office, retail clinic, urgent care center, or emergency room.  If you have a medical emergency, please immediately call 911 or go to the emergency department.  Pager Numbers  - Dr. Hester: [phone]  - Dr. Jackquline: [phone]  - Dr. Claudene: [phone]   - Dr. Raymund: [phone]  In the event of inclement weather, please call our main line at [phone] for an update on the status of any delays or closures.  Dermatology Medication Tips: Please keep the boxes that topical medications come in in order to help keep track of the  instructions about where and how to use these. Pharmacies typically print the medication instructions only on the boxes and not directly on the medication tubes.   If your medication is too expensive, please contact our office at [phone] option 4 or send us  a message through MyChart.   We are unable to tell what your co-pay for medications will be in advance as this is different depending on your insurance coverage. However, we may be able to  find a substitute medication at lower cost or fill out paperwork to get insurance to cover a needed medication.   If a prior authorization is required to get your medication covered by your insurance company, please allow us  1-2 business days to complete this process.  Drug prices often vary depending on where the prescription is filled and some pharmacies may offer cheaper prices.  The website www.goodrx.com contains coupons for medications through different pharmacies. The prices here do not account for what the cost may be with help from insurance (it may be cheaper with your insurance), but the website can give you the price if you did not use any insurance.  - You can print the associated coupon and take it with your prescription to the pharmacy.  - You may also stop by our office during regular business hours and pick up a GoodRx coupon card.  - If you need your prescription sent electronically to a different pharmacy, notify our office through [facility] or by phone at [phone] option 4.     Si Usted Necesita Algo Despus de Su Visita  Tambin puede enviarnos un mensaje a travs de Clinical cytogeneticist. Por lo general respondemos a los mensajes de MyChart en el transcurso de 1 a 2 das hbiles.  Para renovar recetas, por favor pida a su farmacia que se ponga en contacto con nuestra oficina. Randi lakes de fax es [place] [phone].  Si tiene un asunto urgente cuando la clnica est cerrada y que no puede esperar hasta el siguiente da hbil, puede llamar/localizar a su doctor(a) al nmero que aparece a continuacin.   Por favor, tenga en cuenta que aunque hacemos todo lo posible para estar disponibles para asuntos urgentes fuera del horario de [place], no estamos disponibles las 24 horas del da, los 7 [address]  [address] de la [place].   Si tiene un problema urgente y no puede comunicarse con nosotros, puede optar por buscar atencin mdica  en el consultorio de su doctor(a), en una clnica privada,  en un centro de atencin urgente o en una sala de emergencias.  Si tiene Engineer, drilling, por favor llame inmediatamente al 911 o vaya a la sala de emergencias.  Nmeros de bper  - Dr. Hester: [phone]  - Dra. Jackquline: [phone]  - Dr. Claudene: [phone]  - Dra. Kitts: [phone]  En caso de inclemencias del [place], por favor llame a nuestra lnea principal al [phone] para una actualizacin sobre el estado de cualquier retraso o cierre.  Consejos para la medicacin en dermatologa: Por favor, guarde las cajas en las que vienen los medicamentos de uso tpico para ayudarle a seguir las instrucciones sobre dnde y cmo usarlos. Las farmacias generalmente imprimen las instrucciones del medicamento slo en las cajas y no directamente en los tubos del [place].   Si su medicamento es muy caro, por favor, pngase en contacto con landry rieger llamando al [phone] y presione la opcin 4 o envenos un mensaje a travs de Clinical cytogeneticist.   No podemos decirle cul ser su copago por  los medicamentos por adelantado ya que esto es diferente dependiendo de la cobertura de su seguro. Sin embargo, es posible que podamos encontrar un medicamento sustituto a Audiological scientist un formulario para que el seguro cubra el medicamento que se considera necesario.   Si se requiere una autorizacin previa para que su compaa de seguros malta su medicamento, por favor permtanos de 1 a 2 das hbiles para completar este proceso.  Los precios de los medicamentos varan con frecuencia dependiendo del Environmental consultant de dnde se surte la receta y alguna farmacias pueden ofrecer precios ms baratos.  El sitio web www.goodrx.com tiene cupones para medicamentos de Health and safety inspector. Los precios aqu no tienen en cuenta lo que podra costar con la ayuda del seguro (puede ser ms barato con su seguro), pero el sitio web puede darle el precio si no utiliz Tourist information centre manager.  - Puede imprimir el cupn  correspondiente y llevarlo con su receta a la farmacia.  - Tambin puede pasar por nuestra oficina durante el horario de atencin regular y Education officer, museum una tarjeta de cupones de GoodRx.  - Si necesita que su receta se enve electrnicamente a una farmacia diferente, informe a nuestra oficina a travs de MyChart de [place] o por telfono llamando al [phone] y presione la opcin 4.

## 2024-02-04 NOTE — Progress Notes (Signed)
 Follow-Up Visit   Subjective  Rodney Cummings is a 73 y.o. male who presents for the following: Spot that was frozen 12/03/2023 has not resolved. Hits when shaving. Bothersome. Below right ear.   The patient has spots, moles and lesions to be evaluated, some may be new or changing and the patient may have concern these could be cancer.  The following portions of the chart were reviewed this encounter and updated as appropriate: medications, allergies, medical history  Review of Systems:  No other skin or systemic complaints except as noted in HPI or Assessment and Plan.  Objective  Well appearing patient in no apparent distress; mood and affect are within normal limits.  A focused examination was performed of the following areas: Face, neck  Relevant physical exam findings are noted in the Assessment and Plan.  Right Proximal Mandible below earlobe x1 Erythematous papule   Assessment & Plan   ACTINIC DAMAGE WITH PRECANCEROUS ACTINIC KERATOSES Counseling for Topical Chemotherapy Management: Patient exhibits: - Severe, confluent actinic changes with pre-cancerous actinic keratoses that is secondary to cumulative UV radiation exposure over time - Condition that is severe; chronic, not at goal. - diffuse scaly erythematous macules and papules with underlying dyspigmentation - Discussed Prescription Field Treatment topical Chemotherapy for Severe, Chronic Confluent Actinic Changes with Pre-Cancerous Actinic Keratoses Field treatment involves treatment of an entire area of skin that has confluent Actinic Changes (Sun/ Ultraviolet light damage) and PreCancerous Actinic Keratoses by method of PhotoDynamic Therapy (PDT) and/or prescription Topical Chemotherapy agents such as 5-fluorouracil , 5-fluorouracil /calcipotriene, and/or imiquimod.  The purpose is to decrease the number of clinically evident and subclinical PreCancerous lesions to prevent progression to development of skin cancer by  chemically destroying early precancer changes that may or may not be visible.  It has been shown to reduce the risk of developing skin cancer in the treated area. As a result of treatment, redness, scaling, crusting, and open sores may occur during treatment course. One or more than one of these methods may be used and may have to be used several times to control, suppress and eliminate the PreCancerous changes. Discussed treatment course, expected reaction, and possible side effects. - Recommend daily broad spectrum sunscreen SPF 30+ to sun-exposed areas, reapply every 2 hours as needed.  - Staying in the shade or wearing long sleeves, sun glasses (UVA+UVB protection) and wide brim hats (4-inch brim around the entire circumference of the hat) are also recommended. - Call for new or changing lesions.  AK (ACTINIC KERATOSIS) Right Proximal Mandible below earlobe x1 Actinic keratoses are precancerous spots that appear secondary to cumulative UV radiation exposure/sun exposure over time. They are chronic with expected duration over 1 year. A portion of actinic keratoses will progress to squamous cell carcinoma of the skin. It is not possible to reliably predict which spots will progress to skin cancer and so treatment is recommended to prevent development of skin cancer.  Recommend daily broad spectrum sunscreen SPF 30+ to sun-exposed areas, reapply every 2 hours as needed.  Recommend staying in the shade or wearing long sleeves, sun glasses (UVA+UVB protection) and wide brim hats (4-inch brim around the entire circumference of the hat). Call for new or changing lesions.  Starting October 1st- Start 5-fluorouracil /calcipotriene cream twice a day for 7 days to affected area at jaw line below right earlobe. Prescription sent to [facility]. Patient advised they will receive an email to purchase the medication online and have it sent to their home. Patient  provided with handout  reviewing treatment course and side effects and advised to call or message us  on MyChart with any concerns.  Reviewed course of treatment and expected reaction.  Patient advised to expect inflammation and crusting and advised that erosions are possible.  Patient advised to be diligent with sun protection during and after treatment. Counseled to keep medication out of reach of children and pets.  Destruction of lesion - Right Proximal Mandible below earlobe x1 Complexity: simple   Destruction method: cryotherapy   Informed consent: discussed and consent obtained   Timeout:  patient name, date of birth, surgical site, and procedure verified Lesion destroyed using liquid nitrogen: Yes   Region frozen until ice ball extended beyond lesion: Yes   Outcome: patient tolerated procedure well with no complications   Post-procedure details: wound care instructions given   Additional details:  Prior to procedure, discussed risks of blister formation, small wound, skin dyspigmentation, or rare scar following cryotherapy. Recommend Vaseline ointment to treated areas while healing.   fluorouracil  (EFUDEX ) 5 % cream - Right Proximal Mandible below earlobe x1 Apply topically 2 (two) times daily. Apply twice a day for 7 days to affected area at jaw line below right earlobe   Return for AK Follow Up As Scheduled .  I, Jill Parcell, CMA, am acting as scribe for Alm Rhyme, MD.   Documentation: I have reviewed the above documentation for accuracy and completeness, and I agree with the above.  Alm Rhyme, MD

## 2024-02-17 ENCOUNTER — Encounter: Payer: Self-pay | Admitting: Family Medicine

## 2024-02-17 ENCOUNTER — Ambulatory Visit (INDEPENDENT_AMBULATORY_CARE_PROVIDER_SITE_OTHER): Admitting: Family Medicine

## 2024-02-17 VITALS — BP 110/58 | HR 77 | Ht 72.0 in | Wt 202.5 lb

## 2024-02-17 DIAGNOSIS — Z1211 Encounter for screening for malignant neoplasm of colon: Secondary | ICD-10-CM

## 2024-02-17 DIAGNOSIS — Z8249 Family history of ischemic heart disease and other diseases of the circulatory system: Secondary | ICD-10-CM | POA: Diagnosis not present

## 2024-02-17 DIAGNOSIS — R7309 Other abnormal glucose: Secondary | ICD-10-CM | POA: Diagnosis not present

## 2024-02-17 DIAGNOSIS — E7800 Pure hypercholesterolemia, unspecified: Secondary | ICD-10-CM | POA: Diagnosis not present

## 2024-02-17 DIAGNOSIS — Z0000 Encounter for general adult medical examination without abnormal findings: Secondary | ICD-10-CM | POA: Diagnosis not present

## 2024-02-17 DIAGNOSIS — H60543 Acute eczematoid otitis externa, bilateral: Secondary | ICD-10-CM | POA: Diagnosis not present

## 2024-02-17 DIAGNOSIS — K2100 Gastro-esophageal reflux disease with esophagitis, without bleeding: Secondary | ICD-10-CM

## 2024-02-17 MED ORDER — OMEPRAZOLE 40 MG PO CPDR: 40.0000 mg | DELAYED_RELEASE_CAPSULE | Freq: Every day | ORAL | 3 refills | Status: AC

## 2024-02-17 MED ORDER — HYDROCORTISONE-ACETIC ACID 1-2 % OT SOLN: 3.0000 [drp] | Freq: Three times a day (TID) | OTIC | 2 refills | Status: AC

## 2024-02-17 NOTE — Progress Notes (Signed)
 Subjective:    Patient ID: Rodney Cummings, male    DOB: 10-10-1950, 73 y.o.   MRN: [id number]  Rodney Cummings is a 73 y.o. male presenting on 02/17/2024 for Annual Exam   HPI  Discussed the use of AI scribe software for clinical note transcription with the patient, who gave verbal consent to proceed.  History of Present Illness   Rodney Cummings is a 73 year old male who presents for an annual physical exam.  Preventive health maintenance - Presenting for annual physical examination - Has not completed blood work prior to visit due to recent meal; plans to return for fasting blood work on Friday, September 12th at 9:15 AM - Last colon cancer screening in 2015; considering home test for colon cancer screening - Declined pneumonia vaccine upgrade; last received pneumonia vaccine in 2017  Prostate health Followed by Urologist - Recent prostate-specific antigen (PSA) level of 3.7 obtained a few weeks ago - Under care of urologist for prostate health and erectile function medications - Started taking a parasite medication at the beginning of the month; uncertain if this affects PSA levels  Gastrointestinal symptoms - Experienced indigestion and chest pain last month, requiring him to lie down - Requires refill for omeprazole  for gastric acid management  Cardiac symptoms and risk factors - Family history of heart disease; both grandfathers died of massive heart attacks - Brother recommended obtaining an EKG after recent symptoms  Otolaryngologic symptoms - Requires refill for acetic acid  and hydrocortisone  ear drops         02/17/2024    1:28 PM 01/16/2024    1:28 PM 01/10/2023    1:33 PM  Depression screen PHQ 2/9  Decreased Interest 0 0 0  Down, Depressed, Hopeless 0 0 0  PHQ - 2 Score 0 0 0  Altered sleeping 0 0 0  Tired, decreased energy 1 0 0  Change in appetite 0 0 0  Feeling bad or failure about yourself  0 0 0  Trouble concentrating 0 0 0  Moving slowly or  fidgety/restless 0 0 0  Suicidal thoughts 0 0 0  PHQ-9 Score 1 0 0  Difficult doing work/chores Somewhat difficult Not difficult at all Not difficult at all       02/17/2024    1:28 PM 05/24/2022    2:14 PM 12/03/2021    9:32 AM  GAD 7 : Generalized Anxiety Score  Nervous, Anxious, on Edge 0 0 0  Control/stop worrying 0 0 0  Worry too much - different things 0 0 0  Trouble relaxing 0 0 2  Restless 0 0 0  Easily annoyed or irritable 0 0 0  Afraid - awful might happen 0 0 0  Total GAD 7 Score 0 0 2  Anxiety Difficulty Not difficult at all Not difficult at all Not difficult at all     Past Medical History:  Diagnosis Date   Elevated PSA    GERD (gastroesophageal reflux disease)    Hearing loss    Past Surgical History:  Procedure Laterality Date   SQUAMOUS CELL CARCINOMA EXCISION Right 2020   arm    TONSILLECTOMY     age 64   Social History   Socioeconomic History   Marital status: Married    Spouse name: Not on file   Number of children: Not on file   Years of education: Not on file   Highest education level: Not on file  Occupational History   Occupation: retired  Tobacco Use   Smoking status: Never   Smokeless tobacco: Current    Types: Chew  Vaping Use   Vaping status: Never Used  Substance and Sexual Activity   Alcohol use: Yes    Alcohol/week: 0.0 standard drinks of alcohol    Comment: beer on weekends    Drug use: No   Sexual activity: Yes  Other Topics Concern   Not on file  Social History Narrative   Not on file   Social Drivers of Health   Financial Resource Strain: Low Risk  (01/16/2024)   Overall Financial Resource Strain (CARDIA)    Difficulty of Paying Living Expenses: Not hard at all  Food Insecurity: No Food Insecurity (01/16/2024)   Hunger Vital Sign    Worried About Running Out of Food in the Last Year: Never true    Ran Out of Food in the Last Year: Never true  Transportation Needs: No Transportation Needs (01/16/2024)   PRAPARE -  Administrator, Civil Service (Medical): No    Lack of Transportation (Non-Medical): No  Physical Activity: Sufficiently Active (01/16/2024)   Exercise Vital Sign    Days of Exercise per Week: 5 days    Minutes of Exercise per Session: 60 min  Recent Concern: Physical Activity - Inactive (01/13/2024)   Exercise Vital Sign    Days of Exercise per Week: 0 days    Minutes of Exercise per Session: Not on file  Stress: No Stress Concern Present (01/16/2024)   [employer] of Occupational Health - Occupational Stress Questionnaire    Feeling of Stress: Not at all  Social Connections: Socially Integrated (01/16/2024)   Social Connection and Isolation Panel    Frequency of Communication with Friends and Family: Twice a week    Frequency of Social Gatherings with Friends and Family: Twice a week    Attends Religious Services: More than 4 times per year    Active Member of [employer] or Organizations: Yes    Attends Engineer, structural: More than 4 times per year    Marital Status: Married  Catering manager Violence: Not At Risk (01/16/2024)   Humiliation, Afraid, Rape, and Kick questionnaire    Fear of Current or Ex-Partner: No    Emotionally Abused: No    Physically Abused: No    Sexually Abused: No   Family History  Problem Relation Age of Onset   Dementia Mother    Cancer Father        throat/stomach   Dementia Maternal Aunt    Cancer Paternal Grandmother    Cancer Paternal Grandfather    Dementia Maternal Uncle    Current Outpatient Medications on File Prior to Visit  Medication Sig   Cholecalciferol (VITAMIN D3) 1000 units CAPS Take 5,000 Units by mouth.    fluorouracil  (EFUDEX ) 5 % cream Apply topically 2 (two) times daily. Apply twice a day for 7 days to affected area at jaw line below right earlobe   Multiple Vitamin (MULTI-VITAMINS) TABS Take by mouth.   silodosin  (RAPAFLO ) 8 MG CAPS capsule Take 1 capsule (8 mg total) by mouth daily with breakfast.   tadalafil   (CIALIS ) 5 MG tablet daily   vitamin E 180 MG (400 UNITS) capsule Take by mouth.   No current facility-administered medications on file prior to visit.    Review of Systems  Constitutional:  Negative for activity change, appetite change, chills, diaphoresis, fatigue and fever.  HENT:  Negative for congestion and hearing loss.   Eyes:  Negative for visual disturbance.  Respiratory:  Negative for cough, chest tightness, shortness of breath and wheezing.   Cardiovascular:  Negative for chest pain, palpitations and leg swelling.  Gastrointestinal:  Negative for abdominal pain, constipation, diarrhea, nausea and vomiting.  Genitourinary:  Negative for dysuria, frequency and hematuria.  Musculoskeletal:  Negative for arthralgias and neck pain.  Skin:  Negative for rash.  Neurological:  Negative for dizziness, weakness, light-headedness, numbness and headaches.  Hematological:  Negative for adenopathy.  Psychiatric/Behavioral:  Negative for behavioral problems, dysphoric mood and sleep disturbance.    Per HPI unless specifically indicated above     Objective:    BP (!) 110/58 (BP Location: Left Arm, Patient Position: Sitting, Cuff Size: Normal)   Pulse 77   Ht 6' (1.829 m)   Wt 202 lb 8 oz (91.9 kg)   SpO2 96%   BMI 27.46 kg/m   Wt Readings from Last 3 Encounters:  02/17/24 202 lb 8 oz (91.9 kg)  05/22/23 193 lb (87.5 kg)  04/22/23 193 lb (87.5 kg)    Physical Exam Vitals and nursing note reviewed.  Constitutional:      General: He is not in acute distress.    Appearance: He is well-developed. He is not diaphoretic.     Comments: Well-appearing, comfortable, cooperative  HENT:     Head: Normocephalic and atraumatic.  Eyes:     General:        Right eye: No discharge.        Left eye: No discharge.     Conjunctiva/sclera: Conjunctivae normal.     Pupils: Pupils are equal, round, and reactive to light.  Neck:     Thyroid : No thyromegaly.     Vascular: No carotid bruit.   Cardiovascular:     Rate and Rhythm: Normal rate and regular rhythm.     Pulses: Normal pulses.     Heart sounds: Normal heart sounds. No murmur heard. Pulmonary:     Effort: Pulmonary effort is normal. No respiratory distress.     Breath sounds: Normal breath sounds. No wheezing or rales.  Abdominal:     General: Bowel sounds are normal. There is no distension.     Palpations: Abdomen is soft. There is no mass.     Tenderness: There is no abdominal tenderness.  Musculoskeletal:        General: No tenderness. Normal range of motion.     Cervical back: Normal range of motion and neck supple.     Right lower leg: No edema.     Left lower leg: No edema.     Comments: Upper / Lower Extremities: - Normal muscle tone, strength bilateral upper extremities 5/5, lower extremities 5/5  Lymphadenopathy:     Cervical: No cervical adenopathy.  Skin:    General: Skin is warm and dry.     Findings: No erythema or rash.  Neurological:     Mental Status: He is alert and oriented to person, place, and time.     Comments: Distal sensation intact to light touch all extremities  Psychiatric:        Mood and Affect: Mood normal.        Behavior: Behavior normal.        Thought Content: Thought content normal.     Comments: Well groomed, good eye contact, normal speech and thoughts     Results for orders placed or performed in visit on 01/19/24  PSA   Collection Time: 01/19/24  9:11 AM  Result Value Ref Range   Prostate Specific Ag, Serum 3.7 0.0 - 4.0 ng/mL      Assessment & Plan:   Problem List Items Addressed This Visit     Gastroesophageal reflux disease with esophagitis   Relevant Medications   omeprazole  (PRILOSEC) 40 MG capsule   Hyperlipidemia   Relevant Orders   TSH   Lipid panel   CBC with Differential/Platelet   Comprehensive metabolic panel with GFR   Other Visit Diagnoses       Annual physical exam    -  Primary   Relevant Orders   TSH   Lipid panel   Hemoglobin  A1c   CBC with Differential/Platelet   Comprehensive metabolic panel with GFR     Acute eczematoid otitis externa of both ears       Relevant Medications   acetic acid -hydrocortisone  (VOSOL -HC) OTIC solution     Screening for colon cancer       Relevant Orders   Cologuard     Family history of heart disease       Relevant Orders   CT CARDIAC SCORING (SELF PAY ONLY)     Abnormal glucose       Relevant Orders   Hemoglobin A1c        Updated Health Maintenance information Labs due, return fasting 9/12 Encouraged improvement to lifestyle with diet and exercise Goal of weight loss  Annual Wellness Visit Blood work pending. Discussed colon cancer screening; opted for Cologuard. Discussed heart health; recommended CT scan. Declined pneumonia vaccination. - Schedule blood work for 02/20/2024 at 9:15 AM. - Order Cologuard home test. - Order CT scan of the heart. Coronary Calcium  Score - Documented declined pneumonia vaccination.  GERD Controlled Re order Omeprazole   Eczematoid otitis externa Chronic condition managed with acetic acid  and hydrocortisone  ear drops. Refill needed. - Send prescription for acetic acid  and hydrocortisone  ear drops to [facility].           Orders Placed This Encounter  Procedures   CT CARDIAC SCORING (SELF PAY ONLY)    Standing Status:   Future    Expiration Date:   02/16/2025    Preferred imaging location?:   [facility]   Cologuard   TSH    Standing Status:   Future    Expected Date:   02/20/2024    Expiration Date:   05/20/2024   Lipid panel    Standing Status:   Future    Expected Date:   02/20/2024    Expiration Date:   05/20/2024    Has the patient fasted?:   Yes   Hemoglobin A1c    Standing Status:   Future    Expected Date:   02/20/2024    Expiration Date:   05/20/2024   CBC with Differential/Platelet    Standing Status:   Future    Expected Date:   02/20/2024    Expiration Date:   05/20/2024   Comprehensive metabolic  panel with GFR    Standing Status:   Future    Expected Date:   02/20/2024    Expiration Date:   05/20/2024    Has the patient fasted?:   Yes    Meds ordered this encounter  Medications   acetic acid -hydrocortisone  (VOSOL -HC) OTIC solution    Sig: Place 3 drops into both ears 3 (three) times daily.    Dispense:  10 mL    Refill:  2   omeprazole  (PRILOSEC) 40 MG capsule    Sig: Take  1 capsule (40 mg total) by mouth daily before breakfast.    Dispense:  90 capsule    Refill:  3     Follow up plan: Return in about 3 days (around 02/20/2024) for 9/12 for labs.  Lab 9/12 915, all labs except PSA  Marsa Officer, DO [facility] 02/17/2024, 1:38 PM

## 2024-02-17 NOTE — Patient Instructions (Addendum)
 Thank you for coming to the office today.  Ear Drops sent to [facility]  Stomach Acid med sent to [facility] for 1 year  You have been referred for a Coronary Calcium  Score Cardiac CT Scan. This is a screening test for patients aged 74-50+ with cardiovascular risk factors or who are healthy but would be interested in Cardiovascular Screening for heart disease. Even if there is a family history of heart disease, this imaging can be useful. Typically it can be done every 5+ years or at a different timeline we agree on  The scan will look at the chest and mainly focus on the heart and identify early signs of calcium  build up or blockages within the heart arteries. It is not 100% accurate for identifying blockages or heart disease, but it is useful to help us  predict who may have some early changes or be at risk in the future for a heart attack or cardiovascular problem.  The results are reviewed by a Cardiologist and they will document the results. It should become available on MyChart. Typically the results are divided into percentiles based on other patients of the same demographic and age. So it will compare your risk to others similar to you. If you have a higher score >99 or higher percentile >75%tile, it is recommended to consider Statin cholesterol therapy and or referral to Cardiologist. I will try to help explain your results and if we have questions we can contact the Cardiologist.  You will be contacted for scheduling. Usually it is done at any imaging facility through [facility], [facility] or [facility].  The cost is $99 flat fee total and it does not go through insurance, so no authorization is required.  ---------  Colon Cancer Screening:  Ordered the Cologuard (home kit) test for colon cancer screening. Stay tuned for further updates.  It will be shipped to you directly. If not received in 2-4 weeks, call us  or the company.   If you send it  back and no results are received in 2-4 weeks, call us  or the company as well!   Colon Cancer Screening: - For all adults age 110+ routine colon cancer screening is highly recommended.     - Recent guidelines from American Cancer Society recommend starting age of 54 - Early detection of colon cancer is important, because often there are no warning signs or symptoms, also if found early usually it can be cured. Late stage is hard to treat.   - If Cologuard is NEGATIVE, then it is good for 3 years before next due - If Cologuard is POSITIVE, then it is strongly advised to get a Colonoscopy, which allows the GI doctor to locate the source of the cancer or polyp (even very early stage) and treat it by removing it. ------------------------- Follow instructions to collect sample, you may call the company for any help or questions, 24/7 telephone support at [phone].    Please schedule a Follow-up Appointment to: Return in about 3 days (around 02/20/2024) for 9/12 for labs.  If you have any other questions or concerns, please feel free to call the office or send a message through MyChart. You may also schedule an earlier appointment if necessary.  Additionally, you may be receiving a survey about your experience at our office within a few days to 1 week by e-mail or mail. We value your feedback.  Marsa Officer, DO [facility], [place]

## 2024-02-19 ENCOUNTER — Other Ambulatory Visit: Payer: Self-pay | Admitting: Urology

## 2024-02-19 DIAGNOSIS — N401 Enlarged prostate with lower urinary tract symptoms: Secondary | ICD-10-CM

## 2024-02-19 NOTE — Addendum Note (Signed)
 Addended by: EDMAN MARSA PARAS on: 02/19/2024 11:55 AM   Modules accepted: Level of Service

## 2024-02-20 ENCOUNTER — Other Ambulatory Visit

## 2024-02-20 DIAGNOSIS — E7800 Pure hypercholesterolemia, unspecified: Secondary | ICD-10-CM | POA: Diagnosis not present

## 2024-02-20 DIAGNOSIS — Z0000 Encounter for general adult medical examination without abnormal findings: Secondary | ICD-10-CM | POA: Diagnosis not present

## 2024-02-20 DIAGNOSIS — R7309 Other abnormal glucose: Secondary | ICD-10-CM | POA: Diagnosis not present

## 2024-02-21 LAB — CBC WITH DIFFERENTIAL/PLATELET
Absolute Lymphocytes: 2138 {cells}/uL (ref 850–3900)
Absolute Monocytes: 535 {cells}/uL (ref 200–950)
Basophils Absolute: 59 {cells}/uL (ref 0–200)
Basophils Relative: 1.1 %
Eosinophils Absolute: 556 {cells}/uL — ABNORMAL HIGH (ref 15–500)
Eosinophils Relative: 10.3 %
HCT: 49.4 % (ref 38.5–50.0)
Hemoglobin: 15.8 g/dL (ref 13.2–17.1)
MCH: 30.8 pg (ref 27.0–33.0)
MCHC: 32 g/dL (ref 32.0–36.0)
MCV: 96.3 fL (ref 80.0–100.0)
MPV: 10.4 fL (ref 7.5–12.5)
Monocytes Relative: 9.9 %
Neutro Abs: 2111 {cells}/uL (ref 1500–7800)
Neutrophils Relative %: 39.1 %
Platelets: 257 Thousand/uL (ref 140–400)
RBC: 5.13 Million/uL (ref 4.20–5.80)
RDW: 11.8 % (ref 11.0–15.0)
Total Lymphocyte: 39.6 %
WBC: 5.4 Thousand/uL (ref 3.8–10.8)

## 2024-02-21 LAB — LIPID PANEL
Cholesterol: 249 mg/dL — ABNORMAL HIGH (ref <200)
HDL: 84 mg/dL (ref >=40)
LDL Cholesterol (Calc): 145 mg/dL — ABNORMAL HIGH
Non-HDL Cholesterol (Calc): 165 mg/dL — ABNORMAL HIGH (ref <130)
Total CHOL/HDL Ratio: 3 (calc) (ref <5.0)
Triglycerides: 95 mg/dL (ref <150)

## 2024-02-21 LAB — COMPREHENSIVE METABOLIC PANEL WITH GFR
AG Ratio: 1.7 (calc) (ref 1.0–2.5)
ALT: 20 U/L (ref 9–46)
AST: 18 U/L (ref 10–35)
Albumin: 4.5 g/dL (ref 3.6–5.1)
Alkaline phosphatase (APISO): 53 U/L (ref 35–144)
BUN: 14 mg/dL (ref 7–25)
CO2: 28 mmol/L (ref 20–32)
Calcium: 9.7 mg/dL (ref 8.6–10.3)
Chloride: 104 mmol/L (ref 98–110)
Creat: 0.92 mg/dL (ref 0.70–1.28)
Globulin: 2.6 g/dL (ref 1.9–3.7)
Glucose, Bld: 102 mg/dL — ABNORMAL HIGH (ref 65–99)
Potassium: 4.7 mmol/L (ref 3.5–5.3)
Sodium: 140 mmol/L (ref 135–146)
Total Bilirubin: 0.9 mg/dL (ref 0.2–1.2)
Total Protein: 7.1 g/dL (ref 6.1–8.1)
eGFR: 87 mL/min/1.73m2 (ref >=60)

## 2024-02-21 LAB — HEMOGLOBIN A1C
Hgb A1c MFr Bld: 5.1 % (ref <5.7)
Mean Plasma Glucose: 100 mg/dL
eAG (mmol/L): 5.5 mmol/L

## 2024-02-21 LAB — TSH: TSH: 2.87 m[IU]/L (ref 0.40–4.50)

## 2024-02-23 ENCOUNTER — Ambulatory Visit: Payer: Self-pay | Admitting: Family Medicine

## 2024-02-23 DIAGNOSIS — Z1211 Encounter for screening for malignant neoplasm of colon: Secondary | ICD-10-CM | POA: Diagnosis not present

## 2024-02-25 ENCOUNTER — Ambulatory Visit: Admitting: Urology

## 2024-02-27 ENCOUNTER — Ambulatory Visit
Admission: RE | Admit: 2024-02-27 | Discharge: 2024-02-27 | Disposition: A | Discharge status: Home / Self Care | Payer: Self-pay | Source: Ambulatory Visit | Attending: Family Medicine | Admitting: Family Medicine

## 2024-02-27 DIAGNOSIS — Z8249 Family history of ischemic heart disease and other diseases of the circulatory system: Secondary | ICD-10-CM | POA: Insufficient documentation

## 2024-02-28 LAB — COLOGUARD: COLOGUARD: POSITIVE — AB

## 2024-03-02 ENCOUNTER — Other Ambulatory Visit: Payer: Self-pay | Admitting: Family Medicine

## 2024-03-02 ENCOUNTER — Telehealth: Payer: Self-pay

## 2024-03-02 DIAGNOSIS — R195 Other fecal abnormalities: Secondary | ICD-10-CM

## 2024-03-02 NOTE — Telephone Encounter (Signed)
 Copied from CRM #[id number]. Topic: Clinical - Lab/Test Results >> Mar 02, 2024  9:24 AM Turkey B wrote: Reason for CRM: patient called, states he is ok with referral to GI. Patient also stated he wanted his lungs tested and test for parasites

## 2024-03-02 NOTE — Telephone Encounter (Signed)
 Patient notified that  gi will contact him for an appointment. Also notified about lungs.   He is concerned about being tested for parasites. He has had food poisoning in the past from seafood.

## 2024-03-02 NOTE — Telephone Encounter (Signed)
 Please let him know  Referral has been sent to [facility] through [facility]. They will contact to schedule Colonoscopy  2.  Lungs were tested with the CT scan for heart. It showed normal appearance of lungs. No evidence of fluid or pneumonia. No nodules or other abnormalities.  3. I do not do parasite testing routinely here  Marsa Officer, DO [facility] 03/02/2024, 11:43 AM

## 2024-03-04 ENCOUNTER — Other Ambulatory Visit: Payer: Self-pay

## 2024-03-04 ENCOUNTER — Telehealth: Payer: Self-pay

## 2024-03-04 DIAGNOSIS — R195 Other fecal abnormalities: Secondary | ICD-10-CM

## 2024-03-04 DIAGNOSIS — Z1211 Encounter for screening for malignant neoplasm of colon: Secondary | ICD-10-CM

## 2024-03-04 MED ORDER — GOLYTELY 236 G PO SOLR: 4000.0000 mL | Freq: Once | ORAL | 0 refills | Status: AC

## 2024-03-04 NOTE — Telephone Encounter (Signed)
 Gastroenterology Pre-Procedure Review  Request Date: 05/31/24 Requesting Physician: Dr. Melany  PATIENT REVIEW QUESTIONS: The patient responded to the following health history questions as indicated:    1. Are you having any GI issues? no GI Issues, positive cologuard 2. Do you have a personal history of Polyps? no 3. Do you have a family history of Colon Cancer or Polyps? no 4. Diabetes Mellitus? no 5. Joint replacements in the past 12 months?no 6. Major health problems in the past 3 months?no 7. Any artificial heart valves, MVP, or defibrillator?no    MEDICATIONS & ALLERGIES:    Patient reports the following regarding taking any anticoagulation/antiplatelet therapy:   Plavix, Coumadin, Eliquis, Xarelto, Lovenox, Pradaxa, Brilinta, or Effient? no Aspirin? no  Patient confirms/reports the following medications:  Current Outpatient Medications  Medication Sig Dispense Refill   acetic acid -hydrocortisone  (VOSOL -HC) OTIC solution Place 3 drops into both ears 3 (three) times daily. 10 mL 2   Cholecalciferol (VITAMIN D3) 1000 units CAPS Take 5,000 Units by mouth.      fluorouracil  (EFUDEX ) 5 % cream Apply topically 2 (two) times daily. Apply twice a day for 7 days to affected area at jaw line below right earlobe 30 g 1   Multiple Vitamin (MULTI-VITAMINS) TABS Take by mouth.     omeprazole  (PRILOSEC) 40 MG capsule Take 1 capsule (40 mg total) by mouth daily before breakfast. 90 capsule 3   silodosin  (RAPAFLO ) 8 MG CAPS capsule Take 1 capsule (8 mg total) by mouth daily with breakfast. 90 capsule 3   tadalafil  (CIALIS ) 5 MG tablet daily 90 tablet 3   vitamin E 180 MG (400 UNITS) capsule Take by mouth.     No current facility-administered medications for this visit.    Patient confirms/reports the following allergies:  No Known Allergies  No orders of the defined types were placed in this encounter.   AUTHORIZATION INFORMATION Primary Insurance: 1D#: Group #:  Secondary  Insurance: 1D#: Group #:  SCHEDULE INFORMATION: Date: 05/31/24 Time: Location: [facility]

## 2024-03-10 ENCOUNTER — Ambulatory Visit (INDEPENDENT_AMBULATORY_CARE_PROVIDER_SITE_OTHER): Admitting: Urology

## 2024-03-10 ENCOUNTER — Encounter: Payer: Self-pay | Admitting: Urology

## 2024-03-10 VITALS — BP 135/84 | Ht 72.0 in | Wt 192.0 lb

## 2024-03-10 DIAGNOSIS — N5201 Erectile dysfunction due to arterial insufficiency: Secondary | ICD-10-CM | POA: Diagnosis not present

## 2024-03-10 DIAGNOSIS — R350 Frequency of micturition: Secondary | ICD-10-CM

## 2024-03-10 DIAGNOSIS — R972 Elevated prostate specific antigen [PSA]: Secondary | ICD-10-CM

## 2024-03-10 DIAGNOSIS — N4341 Spermatocele of epididymis, single: Secondary | ICD-10-CM

## 2024-03-10 DIAGNOSIS — N401 Enlarged prostate with lower urinary tract symptoms: Secondary | ICD-10-CM

## 2024-03-10 NOTE — Patient Instructions (Signed)
Hydrocelectomy

## 2024-03-10 NOTE — Progress Notes (Signed)
 03/10/2024 7:47 AM   Rodney Cummings [facility] 1950/06/21 [id number]  Referring provider: Edman Marsa PARAS, DO [address] [place],  [place] [address]  Chief Complaint  Patient presents with   Elevated PSA   Urologic history:  1.  Elevated PSA Benign Prostate Biopsy (~2007) following mid 4 range PSA  PSA up to 6.2 with MRI revealing PI-RADS 3/PI-RADS 4 lesions with 56-gram prostate volume.  Fusion biopsy (12/30/2017) at [facility]. Pathology: Standard 12 core biopsy and targeted lesions all showed benign prostate tissue.  One core from the targeted biopsy did show focal chronic inflammatory change. Last PSA (07/07/2018) was 3.7   2.  BPH with lower urinary tract symptoms Currently on silodosin    3.  History of stone disease s/p endoscopic stone removal in 1986   4.  Left spermatocele  5.  Erectile dysfunction Tadalafil  5 mg   HPI: Rodney Cummings is a 73 y.o. male who presents for annual follow-up.  No significant problems since last year's visit Started tadalafil  after last year's visit for BPH/ED which he is taking as needed.  He states this has reduced the need to take silodosin  regularly Mild discomfort secondary to a spermatocele and he inquired about surgical options PSA 01/19/2024 stable at 3.7   PSA trend    Prostate Specific Ag, Serum  Latest Ref Rng 0.0 - 4.0 ng/mL  07/07/2018 3.7   01/06/2019 4.1 (H)   07/06/2019 3.3   01/14/2020 3.8   01/15/2021 4.0   01/15/2022 4.9 (H)   07/22/2022 3.9   01/14/2023 4.2 (H)   01/19/2024 3.7   PMH: Past Medical History:  Diagnosis Date   Elevated PSA    GERD (gastroesophageal reflux disease)    Hearing loss     Surgical History: Past Surgical History:  Procedure Laterality Date   SQUAMOUS CELL CARCINOMA EXCISION Right 2020   arm    TONSILLECTOMY     age 38    Home Medications:  Allergies as of 03/10/2024   No Known Allergies      Medication List        Accurate as of March 10, 2024  7:47 AM. If you have any  questions, ask your nurse or doctor.          acetic acid -hydrocortisone  OTIC solution Commonly known as: VOSOL -HC Place 3 drops into both ears 3 (three) times daily.   fluorouracil  5 % cream Commonly known as: EFUDEX  Apply topically 2 (two) times daily. Apply twice a day for 7 days to affected area at jaw line below right earlobe   Multi-Vitamins Tabs Take by mouth.   omeprazole  40 MG capsule Commonly known as: PRILOSEC Take 1 capsule (40 mg total) by mouth daily before breakfast.   silodosin  8 MG Caps capsule Commonly known as: RAPAFLO  Take 1 capsule (8 mg total) by mouth daily with breakfast.   tadalafil  5 MG tablet Commonly known as: CIALIS  daily   Vitamin D3 25 MCG (1000 UT) Caps Take 5,000 Units by mouth.   vitamin E 180 MG (400 UNITS) capsule Take by mouth.        Allergies: No Known Allergies  Family History: Family History  Problem Relation Age of Onset   Dementia Mother    Cancer Father        throat/stomach   Dementia Maternal Aunt    Cancer Paternal Grandmother    Cancer Paternal Grandfather    Dementia Maternal Uncle     Social History:  reports that he has  never smoked. His smokeless tobacco use includes chew. He reports current alcohol use. He reports that he does not use drugs.   Physical Exam: There were no vitals taken for this visit.  Constitutional:  Alert, No acute distress. HEENT: [place] AT Respiratory: Normal respiratory effort, no increased work of breathing. GU: Large left spermatocele Psychiatric: Normal mood and affect.   Assessment & Plan:    1.  Elevated PSA Most recent PSA in the normal range Continue annual follow-up  2.  BPH with LUTS Improved Continue tadalafil /silodosin   3.  Erectile dysfunction Stable on tadalafil   4.  Left spermatocele We discussed spermatocelectomy including potential risks of bleeding/hematoma, infection.  He wants to hold off on surgery at this time   Glendia JAYSON Barba, MD  [facility] [address], [address] [place], [place] [address] [phone]

## 2024-05-26 ENCOUNTER — Encounter: Payer: Self-pay | Admitting: Gastroenterology

## 2024-05-26 NOTE — Anesthesia Preprocedure Evaluation (Signed)
 Anesthesia Evaluation    Airway        Dental   Pulmonary           Cardiovascular      Neuro/Psych    GI/Hepatic   Endo/Other    Renal/GU      Musculoskeletal   Abdominal   Peds  Hematology   Anesthesia Other Findings GERD Hearing loss Elevated PSA  Reproductive/Obstetrics                              Anesthesia Physical Anesthesia Plan Anesthesia Quick Evaluation

## 2024-05-31 ENCOUNTER — Encounter: Admission: RE | Disposition: A | Payer: Self-pay | Attending: Gastroenterology

## 2024-05-31 ENCOUNTER — Other Ambulatory Visit: Payer: Self-pay

## 2024-05-31 ENCOUNTER — Encounter: Payer: Self-pay | Admitting: Anesthesiology

## 2024-05-31 ENCOUNTER — Ambulatory Visit
Admission: RE | Admit: 2024-05-31 | Discharge: 2024-05-31 | Disposition: A | Discharge status: Home / Self Care | Attending: Gastroenterology | Admitting: Gastroenterology

## 2024-05-31 ENCOUNTER — Encounter: Payer: Self-pay | Admitting: Gastroenterology

## 2024-05-31 DIAGNOSIS — K640 First degree hemorrhoids: Secondary | ICD-10-CM | POA: Diagnosis not present

## 2024-05-31 DIAGNOSIS — D125 Benign neoplasm of sigmoid colon: Secondary | ICD-10-CM | POA: Diagnosis not present

## 2024-05-31 DIAGNOSIS — R195 Other fecal abnormalities: Secondary | ICD-10-CM | POA: Diagnosis not present

## 2024-05-31 DIAGNOSIS — Z1211 Encounter for screening for malignant neoplasm of colon: Secondary | ICD-10-CM | POA: Diagnosis present

## 2024-05-31 DIAGNOSIS — K219 Gastro-esophageal reflux disease without esophagitis: Secondary | ICD-10-CM | POA: Insufficient documentation

## 2024-05-31 DIAGNOSIS — Z9189 Other specified personal risk factors, not elsewhere classified: Secondary | ICD-10-CM

## 2024-05-31 HISTORY — PX: COLONOSCOPY: SHX5424

## 2024-05-31 HISTORY — DX: Benign paroxysmal vertigo, bilateral: H81.13

## 2024-05-31 HISTORY — PX: POLYPECTOMY: SHX149

## 2024-05-31 SURGERY — COLONOSCOPY
Anesthesia: General | Site: Rectum

## 2024-05-31 MED ORDER — STERILE WATER FOR IRRIGATION IR SOLN
Status: DC | PRN
  Administered 2024-05-31: 50 mL

## 2024-05-31 MED ORDER — LIDOCAINE HCL (CARDIAC) PF 100 MG/5ML IV SOSY
PREFILLED_SYRINGE | INTRAVENOUS | Status: DC | PRN
  Administered 2024-05-31: 50 mg via INTRAVENOUS

## 2024-05-31 MED ORDER — SODIUM CHLORIDE 0.9 % IV SOLN: INTRAVENOUS | Status: DC

## 2024-05-31 MED ORDER — PROPOFOL 10 MG/ML IV BOLUS
INTRAVENOUS | Status: DC | PRN
  Administered 2024-05-31 (×3): 40 mg via INTRAVENOUS
  Administered 2024-05-31: 100 mg via INTRAVENOUS

## 2024-05-31 MED ORDER — PROPOFOL 10 MG/ML IV BOLUS
INTRAVENOUS | Status: AC
  Filled 2024-05-31: qty 40

## 2024-05-31 MED ORDER — LIDOCAINE HCL (PF) 2 % IJ SOLN
INTRAMUSCULAR | Status: AC
  Filled 2024-05-31: qty 5

## 2024-05-31 MED ORDER — STERILE WATER FOR IRRIGATION IR SOLN
Status: DC | PRN
  Administered 2024-05-31: 1

## 2024-05-31 MED ORDER — LACTATED RINGERS IV SOLN: INTRAVENOUS | Status: DC

## 2024-05-31 SURGICAL SUPPLY — 11 items
CLIP HMST 235XBRD CATH ROT (MISCELLANEOUS) IMPLANT
ELECTRODE REM PT RTRN 9FT ADLT (ELECTROSURGICAL) IMPLANT
GAUZE SPONGE 4X4 12PLY STRL (GAUZE/BANDAGES/DRESSINGS) IMPLANT
GOWN CVR UNV OPN BCK APRN NK (MISCELLANEOUS) ×4 IMPLANT
KIT DEFENDO VALVE AND CONN (KITS) IMPLANT
KIT PROCEDURE OLYMPUS (MISCELLANEOUS) ×2 IMPLANT
MANIFOLD NEPTUNE II (INSTRUMENTS) ×2 IMPLANT
SNARE LASSO HEX 3 IN 1 (INSTRUMENTS) IMPLANT
SYR 50ML SLIP (SYRINGE) IMPLANT
TRAP ETRAP POLY (MISCELLANEOUS) IMPLANT
WATER STERILE IRR 250ML POUR (IV SOLUTION) ×2 IMPLANT

## 2024-05-31 NOTE — Transfer of Care (Signed)
 Immediate Anesthesia Transfer of Care Note  Patient: Rodney Cummings  Procedure(s) Performed: COLONOSCOPY WITH BIOPSY (Rectum) POLYPECTOMY, INTESTINE (Rectum)  Patient Location: PACU  Anesthesia Type: General  Level of Consciousness: awake, alert  and patient cooperative  Airway and Oxygen Therapy: Patient Spontanous Breathing and Patient connected to supplemental oxygen  Post-op Assessment: Post-op Vital signs reviewed, Patient's Cardiovascular Status Stable, Respiratory Function Stable, Patent Airway and No signs of Nausea or vomiting  Post-op Vital Signs: Reviewed and stable  Complications: No notable events documented.

## 2024-05-31 NOTE — H&P (Signed)
 "  Clotilda Schaffer, MD [facility] [address] [place], [place] [address] Phone:[phone] Fax : [phone]  Primary Care Physician:  Edman Marsa PARAS, DO Primary Gastroenterologist:  Dr. Schaffer  Pre-Procedure History & Physical: HPI:  Rodney Cummings is a 73 y.o. male is here for an colonoscopy as evaluation of positive Cologuard.  Prior procedure? 2018, at [facility]. Fhx CRC? No Blood thinners? No   Past Medical History:  Diagnosis Date   Elevated PSA    GERD (gastroesophageal reflux disease)    Hearing loss     Past Surgical History:  Procedure Laterality Date   SQUAMOUS CELL CARCINOMA EXCISION Right 2020   arm    TONSILLECTOMY     age 35    Prior to Admission medications  Medication Sig Start Date End Date Taking? Authorizing Provider  Multiple Vitamin (MULTI-VITAMINS) TABS Take by mouth.   Yes [provider]  omeprazole  (PRILOSEC) 40 MG capsule Take 1 capsule (40 mg total) by mouth daily before breakfast. 02/17/24  Yes Karamalegos, Marsa PARAS, DO  silodosin  (RAPAFLO ) 8 MG CAPS capsule Take 1 capsule (8 mg total) by mouth daily with breakfast. 01/17/23  Yes Stoioff, Glendia BROCKS, MD  tadalafil  (CIALIS ) 5 MG tablet daily 01/17/23  Yes Stoioff, Scott C, MD  acetic acid -hydrocortisone  (VOSOL -HC) OTIC solution Place 3 drops into both ears 3 (three) times daily. Patient not taking: Reported on 05/26/2024 02/17/24   Edman Marsa PARAS, DO  Cholecalciferol (VITAMIN D3) 1000 units CAPS Take 5,000 Units by mouth.  Patient not taking: Reported on 05/26/2024    [provider]  fluorouracil  (EFUDEX ) 5 % cream Apply topically 2 (two) times daily. Apply twice a day for 7 days to affected area at jaw line below right earlobe Patient not taking: Reported on 05/26/2024 02/04/24   Hester Alm BROCKS, MD  vitamin E 180 MG (400 UNITS) capsule Take by mouth. Patient not taking: Reported on 05/26/2024    [provider]    Allergies as of  03/04/2024   (No Known Allergies)    Family History  Problem Relation Age of Onset   Dementia Mother    Cancer Father        throat/stomach   Dementia Maternal Aunt    Cancer Paternal Grandmother    Cancer Paternal Grandfather    Dementia Maternal Uncle     Social History   Socioeconomic History   Marital status: Married    Spouse name: Not on file   Number of children: Not on file   Years of education: Not on file   Highest education level: Not on file  Occupational History   Occupation: retired  Tobacco Use   Smoking status: Never   Smokeless tobacco: Current    Types: Chew  Vaping Use   Vaping status: Never Used  Substance and Sexual Activity   Alcohol use: Yes    Alcohol/week: 0.0 standard drinks of alcohol    Comment: beer on weekends    Drug use: No   Sexual activity: Yes  Other Topics Concern   Not on file  Social History Narrative   Not on file   Social Drivers of Health   Tobacco Use: High Risk (05/26/2024)   Patient History    Smoking Tobacco Use: Never    Smokeless Tobacco Use: Current    Passive Exposure: Not on file  Financial Resource Strain: Low Risk (01/16/2024)   Overall Financial Resource Strain (CARDIA)    Difficulty of Paying Living Expenses: Not  hard at all  Food Insecurity: No Food Insecurity (01/16/2024)   Epic    Worried About Programme Researcher, Broadcasting/film/video in the Last Year: Never true    Ran Out of Food in the Last Year: Never true  Transportation Needs: No Transportation Needs (01/16/2024)   Epic    Lack of Transportation (Medical): No    Lack of Transportation (Non-Medical): No  Physical Activity: Sufficiently Active (01/16/2024)   Exercise Vital Sign    Days of Exercise per Week: 5 days    Minutes of Exercise per Session: 60 min  Recent Concern: Physical Activity - Inactive (01/13/2024)   Exercise Vital Sign    Days of Exercise per Week: 0 days    Minutes of Exercise per Session: Not on file  Stress: No Stress Concern Present (01/16/2024)    [employer] of Occupational Health - Occupational Stress Questionnaire    Feeling of Stress: Not at all  Social Connections: Socially Integrated (01/16/2024)   Social Connection and Isolation Panel    Frequency of Communication with Friends and Family: Twice a week    Frequency of Social Gatherings with Friends and Family: Twice a week    Attends Religious Services: More than 4 times per year    Active Member of [employer] or Organizations: Yes    Attends Banker Meetings: More than 4 times per year    Marital Status: Married  Catering Manager Violence: Not At Risk (01/16/2024)   Epic    Fear of Current or Ex-Partner: No    Emotionally Abused: No    Physically Abused: No    Sexually Abused: No  Depression (PHQ2-9): Low Risk (02/17/2024)   Depression (PHQ2-9)    PHQ-2 Score: 1  Alcohol Screen: Low Risk (01/16/2024)   Alcohol Screen    Last Alcohol Screening Score (AUDIT): 2  Housing: Unknown (01/16/2024)   Epic    Unable to Pay for Housing in the Last Year: No    Number of Times Moved in the Last Year: Not on file    Homeless in the Last Year: No  Utilities: Not At Risk (01/16/2024)   Epic    Threatened with loss of utilities: No  Health Literacy: Adequate Health Literacy (01/16/2024)   B1300 Health Literacy    Frequency of need for help with medical instructions: Never    Review of Systems: See HPI, otherwise negative ROS  Physical Exam: Wt 85.7 kg   BMI 25.63 kg/m  CONSTITUTIONAL: Well-appearing in no acute distress.  HEENT: Pupils equal, round, Extraocular movements intact. Conjunctivae clear NECK: Neck supple CARDIOVASCULAR: Regular rate, no LE edema  RESPIRATORY: No labored breathing  ABDOMEN: Abdomen soft, nontender, not distended, no guarding, no rigidity SKIN: No apparent skin rashes or lesions. NEUROLOGIC: Normal speech, no focal findings. Mental status alert and oriented x4. PSYCHIATRIC: Mood and affect normal.   Impression/Plan: Rodney Cummings is here  for an colonoscopy to be performed for positive Cologuard.  Risks, benefits, limitations, and alternatives regarding  colonoscopy have been reviewed with the patient.  Questions have been answered.  All parties agreeable.   Rodney CLOTILDA HERO, MD  05/31/2024, 7:00 AM  "

## 2024-05-31 NOTE — Anesthesia Postprocedure Evaluation (Signed)
"   Anesthesia Post Note  Patient: Rodney Cummings  Procedure(s) Performed: COLONOSCOPY WITH BIOPSY (Rectum) POLYPECTOMY, INTESTINE (Rectum)  Patient location during evaluation: PACU Anesthesia Type: General Level of consciousness: awake and alert Pain management: pain level controlled Vital Signs Assessment: post-procedure vital signs reviewed and stable Respiratory status: spontaneous breathing, nonlabored ventilation, respiratory function stable and patient connected to nasal cannula oxygen Cardiovascular status: blood pressure returned to baseline and stable Postop Assessment: no apparent nausea or vomiting Anesthetic complications: no   No notable events documented.   Last Vitals:  Vitals:   05/31/24 0850 05/31/24 0855  BP: (!) 89/59 101/66  Pulse: (!) 56 (!) 53  Resp: 14 (!) 0  Temp:  (!) 35.9 C  SpO2: 94% 97%    Last Pain:  Vitals:   05/31/24 0735  TempSrc: Temporal                 [name]      "

## 2024-05-31 NOTE — Op Note (Signed)
 [facility] Gastroenterology Patient Name: Rodney Cummings Procedure Date: 05/31/2024 8:19 AM MRN: [id number] Account #: [id number] Date of Birth: [birth year] Admit Type: Outpatient Age: 73 Room: [facility] OR ROOM 01 Gender: Male Note Status: Finalized Instrument Name: Colonoscope [id number] Procedure:             Colonoscopy Indications:           Screening for colorectal malignant neoplasm due to                         positive Cologuard test Providers:             Clotilda Schaffer, MD Referring MD:          Marsa DOROTHA Officer (Referring MD) Medicines:             Propofol  per Anesthesia Complications:         No immediate complications. Procedure:             Pre-Anesthesia Assessment:                        - Prior to the procedure, a History and Physical was                         performed, and patient medications and allergies were                         reviewed. The patient's tolerance of previous                         anesthesia was also reviewed. The risks and benefits                         of the procedure and the sedation options and risks                         were discussed with the patient. All questions were                         answered, and informed consent was obtained. Prior                         Anticoagulants: The patient has taken no anticoagulant                         or antiplatelet agents. ASA Grade Assessment: II - A                         patient with mild systemic disease. After reviewing                         the risks and benefits, the patient was deemed in                         satisfactory condition to undergo the procedure.                        After obtaining informed consent, the colonoscope was  passed under direct vision. Throughout the procedure,                         the patient's blood pressure, pulse, and oxygen                         saturations were monitored continuously. The                          Colonoscope was introduced through the anus and                         advanced to the 10 cm into the ileum. The colonoscopy                         was performed without difficulty. The patient                         tolerated the procedure well. The quality of the bowel                         preparation was good. The terminal ileum, ileocecal                         valve, appendiceal orifice, and rectum were                         photographed. Findings:      A 10 mm polyp was found in the sigmoid colon. The polyp was       pedunculated. The polyp was removed with a hot snare. Resection and       retrieval were complete. To prevent bleeding post-intervention, one       hemostatic clip was successfully placed. There was no bleeding at the       end of the procedure.      Internal hemorrhoids were found during retroflexion. The hemorrhoids       were Grade I (internal hemorrhoids that do not prolapse).      The terminal ileum appeared normal. Impression:            - One 10 mm polyp in the sigmoid colon, removed with a                         hot snare. Resected and retrieved. Clip was placed.                        - Internal hemorrhoids.                        - The examined portion of the ileum was normal. Recommendation:        - Patient has a contact number available for                         emergencies. The signs and symptoms of potential                         delayed complications were discussed with the patient.  Return to normal activities tomorrow. Written                         discharge instructions were provided to the patient.                        - High fiber diet indefinitely.                        - No aspirin, ibuprofen, naproxen , or other                         non-steroidal anti-inflammatory drugs for 2 weeks                         after polyp removal.                        - Await pathology results.                         - Repeat colonoscopy in 3 - 5 years for surveillance                         based on pathology results.                        - The findings and recommendations were discussed with                         the designated responsible adult. Procedure Code(s):     --- Professional ---                        [phone], Colonoscopy, flexible; with removal of                         tumor(s), polyp(s), or other lesion(s) by snare                         technique Diagnosis Code(s):     --- Professional ---                        Z12.11, Encounter for screening for malignant neoplasm                         of colon                        R19.5, Other fecal abnormalities                        D12.5, Benign neoplasm of sigmoid colon                        K64.0, First degree hemorrhoids CPT copyright 2022 American Medical Association. All rights reserved. The codes documented in this report are preliminary and upon coder review may  be revised to meet current compliance requirements. Clotilda Schaffer, MD 05/31/2024 8:45:06 AM Number of Addenda: 0 Note Initiated On: 05/31/2024 8:19 AM Scope Withdrawal Time: 0 hours 7 minutes 50 seconds  Total Procedure  Duration: 0 hours 13 minutes 58 seconds  Estimated Blood Loss:  Estimated blood loss: none.      [facility]

## 2024-05-31 NOTE — Addendum Note (Signed)
 Addendum  created 05/31/24 9047 by Ola Donny BROCKS, MD   Clinical Note Signed

## 2024-06-01 LAB — SURGICAL PATHOLOGY

## 2024-06-07 ENCOUNTER — Other Ambulatory Visit: Payer: Self-pay | Admitting: *Deleted

## 2024-06-07 ENCOUNTER — Ambulatory Visit: Payer: Self-pay | Admitting: Gastroenterology

## 2024-06-07 DIAGNOSIS — N401 Enlarged prostate with lower urinary tract symptoms: Secondary | ICD-10-CM

## 2024-06-07 MED ORDER — SILODOSIN 8 MG PO CAPS: 8.0000 mg | ORAL_CAPSULE | Freq: Every day | ORAL | 3 refills | Status: AC

## 2024-07-28 ENCOUNTER — Ambulatory Visit: Admitting: Dermatology

## 2025-01-28 ENCOUNTER — Ambulatory Visit

## 2025-02-09 ENCOUNTER — Ambulatory Visit

## 2025-03-04 ENCOUNTER — Other Ambulatory Visit

## 2025-03-11 ENCOUNTER — Ambulatory Visit: Admitting: Urology
